# Patient Record
Sex: Female | Born: 1990
Health system: Southern US, Community
[De-identification: ages and names within clinical notes are randomized; demographics above are authoritative.]

## PROBLEM LIST (undated history)

## (undated) ENCOUNTER — Inpatient Hospital Stay (HOSPITAL_COMMUNITY): Payer: Self-pay

## (undated) DIAGNOSIS — B009 Herpesviral infection, unspecified: Secondary | ICD-10-CM

## (undated) DIAGNOSIS — Z789 Other specified health status: Secondary | ICD-10-CM

## (undated) HISTORY — PX: NO PAST SURGERIES: SHX2092

---
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
## 2010-05-02 LAB — CBC
HCT: 29.2 % — ABNORMAL LOW (ref 36.0–46.0)
HCT: 35 % — ABNORMAL LOW (ref 36.0–46.0)
Hemoglobin: 11.7 g/dL — ABNORMAL LOW (ref 12.0–15.0)
Hemoglobin: 9.9 g/dL — ABNORMAL LOW (ref 12.0–15.0)
MCHC: 33.5 g/dL (ref 30.0–36.0)
MCHC: 34.1 g/dL (ref 30.0–36.0)
MCV: 87.3 fL (ref 78.0–100.0)
Platelets: 168 10*3/uL (ref 150–400)
RBC: 3.36 MIL/uL — ABNORMAL LOW (ref 3.87–5.11)
RBC: 4 MIL/uL (ref 3.87–5.11)
RBC: 4.01 MIL/uL (ref 3.87–5.11)
RDW: 14.1 % (ref 11.5–15.5)
RDW: 14.1 % (ref 11.5–15.5)
RDW: 14.1 % (ref 11.5–15.5)
WBC: 11.8 10*3/uL — ABNORMAL HIGH (ref 4.0–10.5)
WBC: 12.5 10*3/uL — ABNORMAL HIGH (ref 4.0–10.5)
WBC: 13.9 10*3/uL — ABNORMAL HIGH (ref 4.0–10.5)

## 2010-05-02 LAB — RPR: RPR Ser Ql: NONREACTIVE

## 2010-05-02 LAB — ABO/RH: ABO/RH(D): O POS

## 2010-05-03 LAB — DIFFERENTIAL
Eosinophils Absolute: 0.4 10*3/uL (ref 0.0–0.7)
Eosinophils Relative: 3 % (ref 0–5)
Monocytes Relative: 8 % (ref 3–12)
Neutro Abs: 8.3 10*3/uL — ABNORMAL HIGH (ref 1.7–7.7)
Neutrophils Relative %: 71 % (ref 43–77)

## 2010-05-03 LAB — CBC
HCT: 34.1 % — ABNORMAL LOW (ref 36.0–46.0)
Hemoglobin: 11.6 g/dL — ABNORMAL LOW (ref 12.0–15.0)
MCHC: 33.9 g/dL (ref 30.0–36.0)
Platelets: 190 10*3/uL (ref 150–400)
Platelets: 181 10*3/uL (ref 150–400)
RBC: 3.92 MIL/uL (ref 3.87–5.11)
RDW: 14.1 % (ref 11.5–15.5)
WBC: 11.7 10*3/uL — ABNORMAL HIGH (ref 4.0–10.5)
WBC: 12.2 10*3/uL — ABNORMAL HIGH (ref 4.0–10.5)

## 2010-05-03 LAB — URINALYSIS, ROUTINE W REFLEX MICROSCOPIC
Bilirubin Urine: NEGATIVE
Protein, ur: NEGATIVE mg/dL
Specific Gravity, Urine: 1.015 (ref 1.005–1.030)
Specific Gravity, Urine: <1.005 — ABNORMAL LOW (ref 1.005–1.030)
pH: 6 (ref 5.0–8.0)
pH: 6.5 (ref 5.0–8.0)

## 2010-05-03 LAB — RPR
RPR Ser Ql: NONREACTIVE
RPR Ser Ql: NONREACTIVE

## 2010-05-08 LAB — URINALYSIS, ROUTINE W REFLEX MICROSCOPIC
Hgb urine dipstick: NEGATIVE
Protein, ur: NEGATIVE mg/dL
Specific Gravity, Urine: 1.01 (ref 1.005–1.030)

## 2010-05-29 ENCOUNTER — Inpatient Hospital Stay (HOSPITAL_COMMUNITY)
Admission: AD | Admit: 2010-05-29 | Discharge: 2010-05-29 | Disposition: A | Discharge status: Home / Self Care | Payer: BC Managed Care – PPO | Source: Ambulatory Visit | Attending: Obstetrics and Gynecology | Admitting: Obstetrics and Gynecology

## 2010-05-29 DIAGNOSIS — N764 Abscess of vulva: Secondary | ICD-10-CM | POA: Insufficient documentation

## 2010-05-29 DIAGNOSIS — L678 Other hair color and hair shaft abnormalities: Secondary | ICD-10-CM | POA: Insufficient documentation

## 2010-05-29 DIAGNOSIS — L738 Other specified follicular disorders: Secondary | ICD-10-CM | POA: Insufficient documentation

## 2010-06-01 LAB — CULTURE, ROUTINE-ABSCESS

## 2010-06-13 ENCOUNTER — Emergency Department (HOSPITAL_COMMUNITY)
Admission: EM | Admit: 2010-06-13 | Discharge: 2010-06-13 | Disposition: A | Discharge status: Home / Self Care | Payer: BC Managed Care – PPO | Attending: Emergency Medicine | Admitting: Emergency Medicine

## 2010-06-13 DIAGNOSIS — J029 Acute pharyngitis, unspecified: Secondary | ICD-10-CM | POA: Insufficient documentation

## 2010-06-13 DIAGNOSIS — J309 Allergic rhinitis, unspecified: Secondary | ICD-10-CM | POA: Insufficient documentation

## 2010-06-13 DIAGNOSIS — J3489 Other specified disorders of nose and nasal sinuses: Secondary | ICD-10-CM | POA: Insufficient documentation

## 2010-06-13 DIAGNOSIS — R0982 Postnasal drip: Secondary | ICD-10-CM | POA: Insufficient documentation

## 2010-06-13 LAB — RAPID STREP SCREEN (MED CTR MEBANE ONLY): Streptococcus, Group A Screen (Direct): NEGATIVE

## 2010-07-22 ENCOUNTER — Emergency Department (HOSPITAL_COMMUNITY): Payer: BC Managed Care – PPO

## 2010-07-22 ENCOUNTER — Emergency Department (HOSPITAL_COMMUNITY)
Admission: EM | Admit: 2010-07-22 | Discharge: 2010-07-22 | Disposition: A | Discharge status: Home / Self Care | Payer: BC Managed Care – PPO | Attending: Emergency Medicine | Admitting: Emergency Medicine

## 2010-07-22 DIAGNOSIS — M25569 Pain in unspecified knee: Secondary | ICD-10-CM | POA: Insufficient documentation

## 2010-07-22 DIAGNOSIS — M79609 Pain in unspecified limb: Secondary | ICD-10-CM | POA: Insufficient documentation

## 2010-07-22 DIAGNOSIS — IMO0002 Reserved for concepts with insufficient information to code with codable children: Secondary | ICD-10-CM | POA: Insufficient documentation

## 2010-07-22 DIAGNOSIS — Y92410 Unspecified street and highway as the place of occurrence of the external cause: Secondary | ICD-10-CM | POA: Insufficient documentation

## 2010-07-22 DIAGNOSIS — Y998 Other external cause status: Secondary | ICD-10-CM | POA: Insufficient documentation

## 2011-03-01 ENCOUNTER — Encounter (HOSPITAL_COMMUNITY): Payer: Self-pay | Admitting: *Deleted

## 2011-03-01 ENCOUNTER — Inpatient Hospital Stay (HOSPITAL_COMMUNITY)
Admission: AD | Admit: 2011-03-01 | Discharge: 2011-03-01 | Disposition: A | Discharge status: Home / Self Care | Payer: BC Managed Care – PPO | Source: Ambulatory Visit | Attending: Obstetrics and Gynecology | Admitting: Obstetrics and Gynecology

## 2011-03-01 DIAGNOSIS — B373 Candidiasis of vulva and vagina: Secondary | ICD-10-CM

## 2011-03-01 DIAGNOSIS — B3731 Acute candidiasis of vulva and vagina: Secondary | ICD-10-CM | POA: Insufficient documentation

## 2011-03-01 DIAGNOSIS — L293 Anogenital pruritus, unspecified: Secondary | ICD-10-CM | POA: Insufficient documentation

## 2011-03-01 DIAGNOSIS — N390 Urinary tract infection, site not specified: Secondary | ICD-10-CM | POA: Insufficient documentation

## 2011-03-01 HISTORY — DX: Other specified health status: Z78.9

## 2011-03-01 LAB — URINALYSIS, ROUTINE W REFLEX MICROSCOPIC
Nitrite: NEGATIVE
Protein, ur: NEGATIVE mg/dL
pH: 6.5 (ref 5.0–8.0)

## 2011-03-01 LAB — WET PREP, GENITAL: Trich, Wet Prep: NONE SEEN

## 2011-03-01 LAB — POCT PREGNANCY, URINE: Preg Test, Ur: NEGATIVE

## 2011-03-01 LAB — URINE MICROSCOPIC-ADD ON

## 2011-03-01 MED ORDER — SULFAMETHOXAZOLE-TRIMETHOPRIM 800-160 MG PO TABS: 1.0000 | ORAL_TABLET | Freq: Two times a day (BID) | ORAL | Status: DC

## 2011-03-01 MED ORDER — FLUCONAZOLE 150 MG PO TABS: 150.0000 mg | ORAL_TABLET | Freq: Once | ORAL | Status: AC

## 2011-03-01 MED ORDER — FLUCONAZOLE 150 MG PO TABS: 150.0000 mg | ORAL_TABLET | Freq: Once | ORAL | Status: DC

## 2011-03-01 MED ORDER — SULFAMETHOXAZOLE-TRIMETHOPRIM 800-160 MG PO TABS: 1.0000 | ORAL_TABLET | Freq: Two times a day (BID) | ORAL | Status: AC

## 2011-03-01 MED ORDER — FLUCONAZOLE 150 MG PO TABS
150.0000 mg | ORAL_TABLET | Freq: Once | ORAL | Status: AC
  Administered 2011-03-01: 150 mg via ORAL
  Filled 2011-03-01: qty 1

## 2011-03-01 MED ORDER — SULFAMETHOXAZOLE-TMP DS 800-160 MG PO TABS
1.0000 | ORAL_TABLET | Freq: Once | ORAL | Status: AC
  Administered 2011-03-01: 1 via ORAL
  Filled 2011-03-01: qty 1

## 2011-03-01 NOTE — Progress Notes (Signed)
Pt in c/o vaginal and anal irritation x2 days.  [facility] and had bacterial vaginosis, was tested for all STDS, were negative. Denies any discharge.

## 2011-03-01 NOTE — ED Provider Notes (Signed)
History     CSN: [id number]  Arrival date & time 03/01/11  2154   None     Chief Complaint  Patient presents with  . Vaginal Itching    HPI Kimberly Santos is a 21 y.o. female who presents to MAU for vaginal itching and discharge. She was treated with antibiotics a couple weeks ago for Bacterial Vaginosis and then developed the itching. She has been using Vagasil which seems to make the symptoms worse. She denies any other problems. The history was provided by the patient.  Past Medical History  Diagnosis Date  . No pertinent past medical history     Past Surgical History  Procedure Date  . No past surgeries     History reviewed. No pertinent family history.  History  Substance Use Topics  . Smoking status: Never Smoker   . Smokeless tobacco: Not on file  . Alcohol Use: No    OB History    Grav Para Term Preterm Abortions TAB SAB Ect Mult Living   1 1 1       1       Review of Systems  Genitourinary: Positive for dysuria, frequency and vaginal discharge. Vaginal pain: vaginal itching.  All other systems reviewed and are negative.    Allergies  Penicillins  Home Medications  No current outpatient prescriptions on file.  BP 125/77  Pulse 87  Temp(Src) 98.7 F (37.1 C) (Oral)  Resp 18  Ht 5\' 3"  (1.6 m)  Wt 132 lb (59.875 kg)  BMI 23.38 kg/m2  LMP 01/18/2011  Physical Exam  Nursing note and vitals reviewed. Constitutional: She is oriented to person, place, and time. She appears well-developed and well-nourished.  HENT:  Head: Normocephalic.  Eyes: EOM are normal.  Neck: Neck supple.  Cardiovascular: Normal rate.   Pulmonary/Chest: Effort normal.  Abdominal: Soft. There is no tenderness.  Genitourinary:       External genitalia without lesions, irritation noted. Thick white cheesy discharge vaginal vault. No CMT, no adnexal tenderness or mass palpated. Uterus without palpable enlargement.  Musculoskeletal: Normal range of motion.  Neurological:  She is alert and oriented to person, place, and time. No cranial nerve deficit.  Skin: Skin is warm and dry.  Psychiatric: She has a normal mood and affect. Her behavior is normal. Judgment and thought content normal.   Results for orders placed during the hospital encounter of 03/01/11 (from the past 24 hour(s))  URINALYSIS, ROUTINE W REFLEX MICROSCOPIC     Status: Abnormal   Collection Time   03/01/11 10:15 PM      Component Value Range   Color, Urine YELLOW  YELLOW    APPearance CLEAR  CLEAR    Specific Gravity, Urine 1.020  1.005 - 1.030    pH 6.5  5.0 - 8.0    Glucose, UA NEGATIVE  NEGATIVE (mg/dL)   Hgb urine dipstick NEGATIVE  NEGATIVE    Bilirubin Urine NEGATIVE  NEGATIVE    Ketones, ur 15 (*) NEGATIVE (mg/dL)   Protein, ur NEGATIVE  NEGATIVE (mg/dL)   Urobilinogen, UA 1.0  0.0 - 1.0 (mg/dL)   Nitrite NEGATIVE  NEGATIVE    Leukocytes, UA MODERATE (*) NEGATIVE   URINE MICROSCOPIC-ADD ON     Status: Abnormal   Collection Time   03/01/11 10:15 PM      Component Value Range   Squamous Epithelial / LPF FEW (*) RARE    WBC, UA 11-20  <3 (WBC/hpf)   RBC / HPF 0-2  <  3 (RBC/hpf)   Bacteria, UA FEW (*) RARE    Urine-Other MUCOUS PRESENT    WET PREP, GENITAL     Status: Abnormal   Collection Time   03/01/11 10:41 PM      Component Value Range   Yeast, Wet Prep RARE (*) NONE SEEN    Trich, Wet Prep NONE SEEN  NONE SEEN    Clue Cells, Wet Prep NONE SEEN  NONE SEEN    WBC, Wet Prep HPF POC MANY (*) NONE SEEN   POCT PREGNANCY, URINE     Status: Normal   Collection Time   03/01/11 10:55 PM      Component Value Range   Preg Test, Ur NEGATIVE     Assessment: Monilia vaginosis   UTI  Plan:  Diflucan 150 mg po now   Rx Bactrim DS    OTC yeast cream   Cultures pending.  ED Course  Procedures    MDM          Kerrie Buffalo, NP 03/01/11 2303

## 2011-03-02 LAB — GC/CHLAMYDIA PROBE AMP, GENITAL: GC Probe Amp, Genital: NEGATIVE

## 2011-03-03 NOTE — ED Provider Notes (Signed)
Attestation of Attending Supervision of Advanced Practitioner: Evaluation and management procedures were performed by the PA/NP/CNM/OB Fellow under my supervision/collaboration. Chart reviewed and agree with management and plan.  Kamy Poinsett V 03/03/2011 4:21 PM

## 2012-03-01 IMAGING — US US OB LIMITED
1 series · 13 of 13 positions shown · non-contrast
Comparison: none

OBSTETRICAL ULTRASOUND:
 This ultrasound was performed in The [HOSPITAL], and the [facility] report will be stored to [REDACTED] PACS.  This report is also available in [HOSPITAL]?s accessANYware.

[Series 1: us ob limited · 13 acquisitions, 13 frames shown]
[im 1/13]
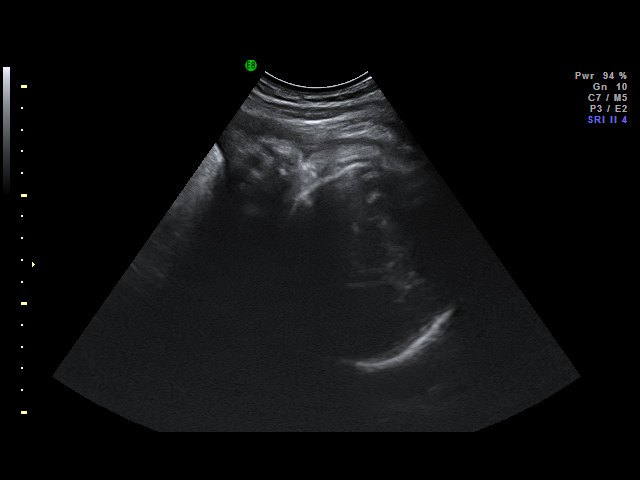
[im 2/13]
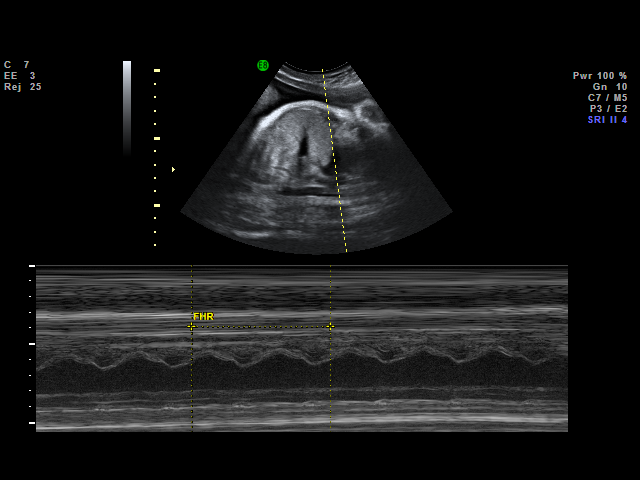
[im 3/13]
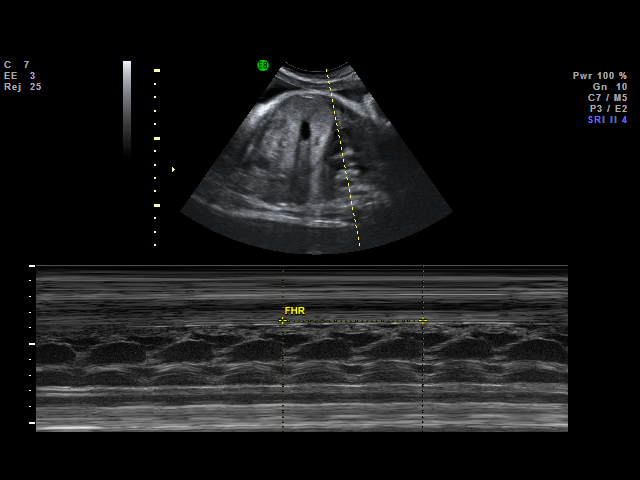
[im 4/13]
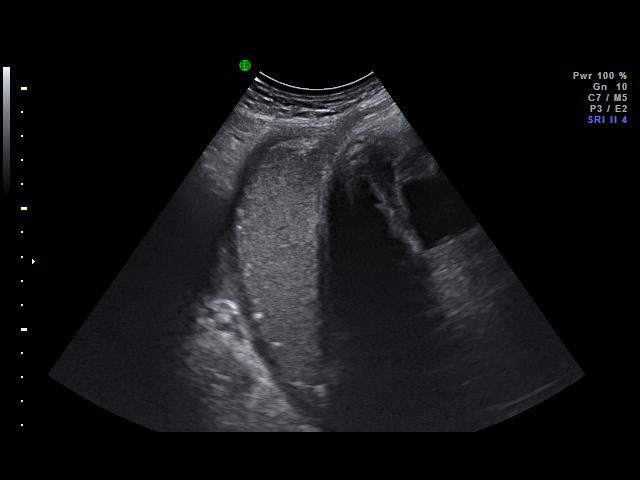
[im 5/13]
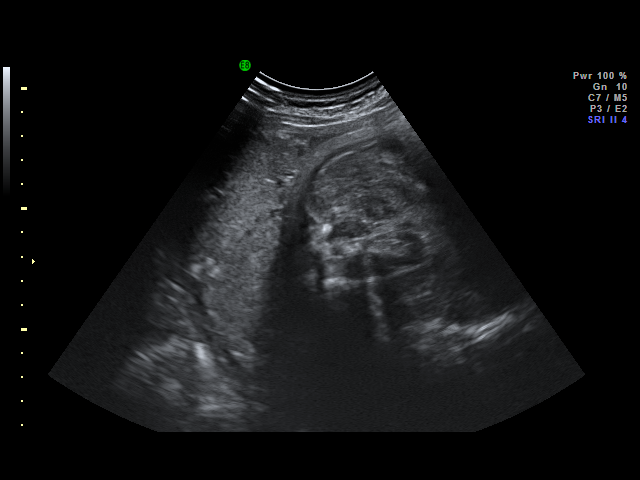
[im 6/13]
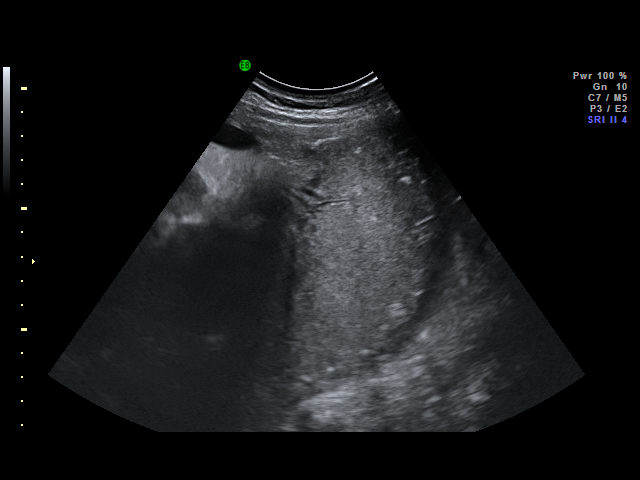
[im 7/13]
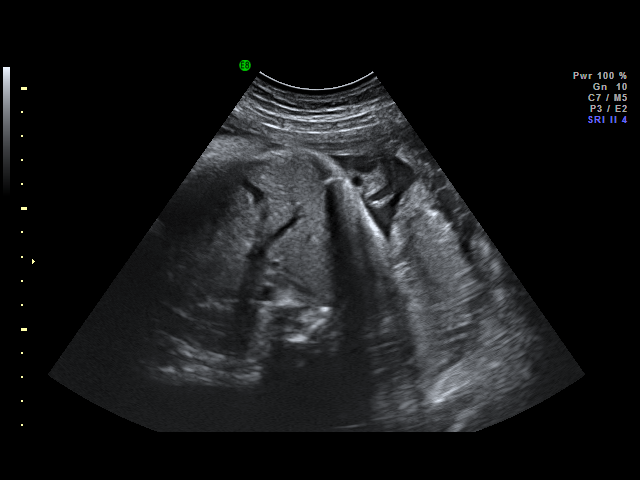
[im 8/13]
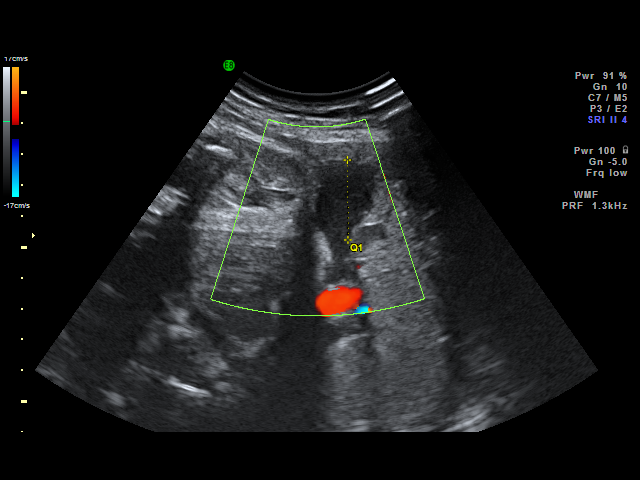
[im 9/13]
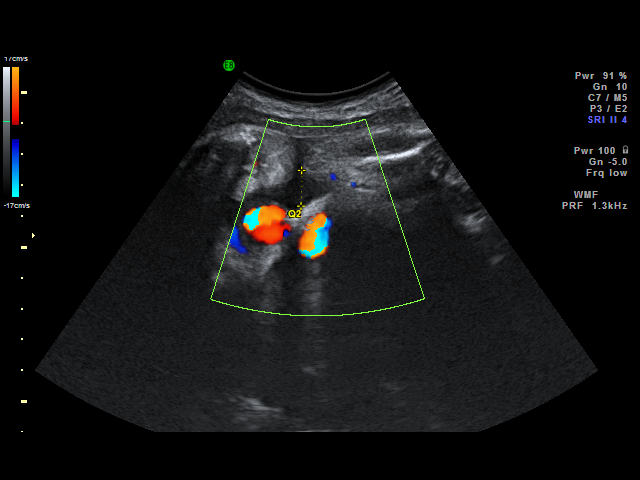
[im 10/13]
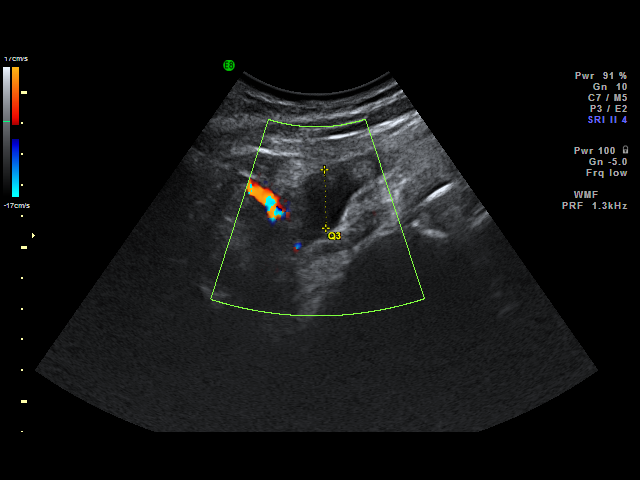
[im 11/13]
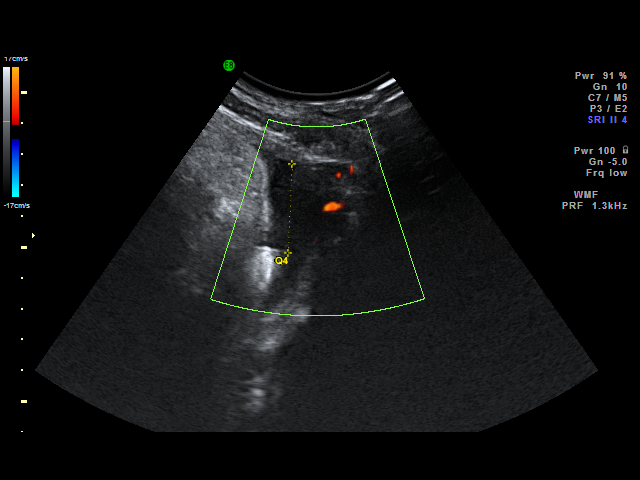
[im 12/13]
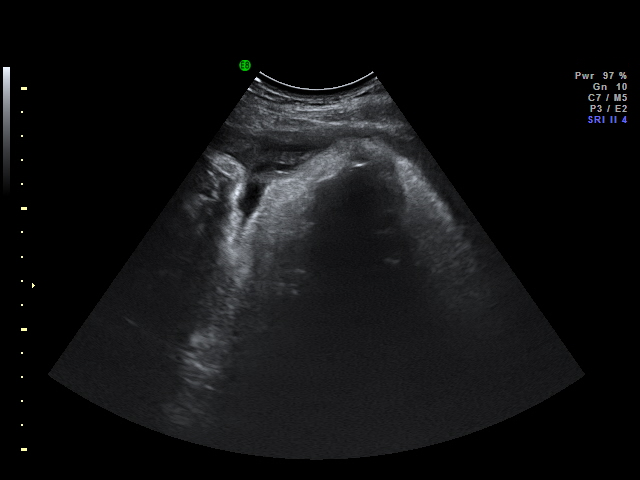
[im 13/13]
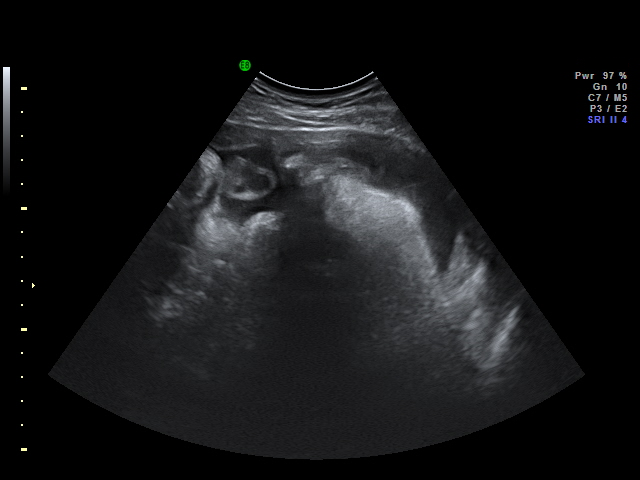

[13 of 13 positions shown; findings below may reference images not displayed]

IMPRESSION: [facility] has also been faxed to the ordering physician.

## 2012-06-27 ENCOUNTER — Inpatient Hospital Stay (HOSPITAL_COMMUNITY)
Admission: AD | Admit: 2012-06-27 | Discharge: 2012-06-27 | Disposition: A | Discharge status: Home / Self Care | Payer: BC Managed Care – PPO | Source: Ambulatory Visit | Attending: Obstetrics & Gynecology | Admitting: Obstetrics & Gynecology

## 2012-06-27 ENCOUNTER — Encounter (HOSPITAL_COMMUNITY): Payer: Self-pay | Admitting: *Deleted

## 2012-06-27 DIAGNOSIS — R109 Unspecified abdominal pain: Secondary | ICD-10-CM | POA: Insufficient documentation

## 2012-06-27 DIAGNOSIS — B9689 Other specified bacterial agents as the cause of diseases classified elsewhere: Secondary | ICD-10-CM

## 2012-06-27 DIAGNOSIS — A499 Bacterial infection, unspecified: Secondary | ICD-10-CM

## 2012-06-27 DIAGNOSIS — N760 Acute vaginitis: Secondary | ICD-10-CM | POA: Insufficient documentation

## 2012-06-27 DIAGNOSIS — IMO0002 Reserved for concepts with insufficient information to code with codable children: Secondary | ICD-10-CM | POA: Insufficient documentation

## 2012-06-27 LAB — CBC WITH DIFFERENTIAL/PLATELET
Eosinophils Absolute: 0.2 10*3/uL (ref 0.0–0.7)
Eosinophils Relative: 3 % (ref 0–5)
HCT: 39.8 % (ref 36.0–46.0)
Hemoglobin: 13.5 g/dL (ref 12.0–15.0)
Lymphs Abs: 3.3 10*3/uL (ref 0.7–4.0)
MCH: 27.8 pg (ref 26.0–34.0)
MCV: 82.1 fL (ref 78.0–100.0)
Monocytes Relative: 8 % (ref 3–12)
RBC: 4.85 MIL/uL (ref 3.87–5.11)

## 2012-06-27 LAB — URINALYSIS, ROUTINE W REFLEX MICROSCOPIC
Bilirubin Urine: NEGATIVE
Glucose, UA: NEGATIVE mg/dL
Ketones, ur: NEGATIVE mg/dL
pH: 6.5 (ref 5.0–8.0)

## 2012-06-27 LAB — WET PREP, GENITAL: Trich, Wet Prep: NONE SEEN

## 2012-06-27 MED ORDER — METRONIDAZOLE 500 MG PO TABS: 500.0000 mg | ORAL_TABLET | Freq: Two times a day (BID) | ORAL | Status: DC

## 2012-06-27 NOTE — MAU Provider Note (Signed)
History     CSN: [id number]  Arrival date and time: 06/27/12 1478   First Provider Initiated Contact with Patient 06/27/12 1814      Chief Complaint  Patient presents with  . Abdominal Cramping   HPI Kimberly Santos is 22 y.o. [id number] Unknown weeks presenting with cramping.  Had TAB at [facility] [place] 3 weeks ago.  Did not go for follow up.  LMP 06/20/12 described as a "normal" cycle.  She began OCP 6 days ago.  Denies bleeding but has a vaginal discharge with odor.  Denies fever, chills, or UTI.     Past Medical History  Diagnosis Date  . No pertinent past medical history     Past Surgical History  Procedure Laterality Date  . No past surgeries      History reviewed. No pertinent family history.  History  Substance Use Topics  . Smoking status: Never Smoker   . Smokeless tobacco: Not on file  . Alcohol Use: No    Allergies:  Allergies  Allergen Reactions  . Penicillins Other (See Comments)    Childhood allergy; reaction unknown    Prescriptions prior to admission  Medication Sig Dispense Refill  . acetaminophen (TYLENOL) 500 MG tablet Take 500 mg by mouth every 6 (six) hours as needed for pain.      . diphenhydrAMINE (BENADRYL) 25 MG tablet Take 25 mg by mouth every 6 (six) hours as needed for allergies.      Marland Kitchen DM-Doxylamine-Acetaminophen (NYQUIL COLD & FLU PO) Take 2 capsules by mouth at bedtime as needed (For allergy symptoms).         Review of Systems  Constitutional: Negative for fever and chills.  Gastrointestinal: Positive for abdominal pain (cramping).  Genitourinary: Negative for dysuria, urgency and frequency.       + for vaginal discharge with odor.  No vaginal bleeding   Physical Exam   Blood pressure 109/63, pulse 84, temperature 97.7 F (36.5 C), temperature source Oral, resp. rate 18, height 5\' 3"  (1.6 m), weight 143 lb (64.864 kg).  Physical Exam  Constitutional: She is oriented to person, place, and time. She appears  well-developed and well-nourished. No distress.  HENT:  Head: Normocephalic.  Neck: Normal range of motion.  Cardiovascular: Normal rate.   Respiratory: Effort normal.  GI: Soft. She exhibits no distension and no mass. There is no tenderness. There is no rebound and no guarding.  Genitourinary: There is no rash, tenderness or lesion on the right labia. There is no rash, tenderness or lesion on the left labia. Uterus is not enlarged and not tender. Cervix exhibits no discharge and no friability. Right adnexum displays no mass, no tenderness and no fullness. Left adnexum displays no mass, no tenderness and no fullness. No erythema, tenderness or bleeding around the vagina. Vaginal discharge (malodorous frothy discharge-small amount) found.  Neurological: She is alert and oriented to person, place, and time.  Skin: Skin is warm and dry.  Psychiatric: She has a normal mood and affect. Her behavior is normal.   Results for orders placed during the hospital encounter of 06/27/12 (from the past 24 hour(s))  URINALYSIS, ROUTINE W REFLEX MICROSCOPIC     Status: None   Collection Time    06/27/12  4:50 PM      Result Value Range   Color, Urine YELLOW  YELLOW   APPearance CLEAR  CLEAR   Specific Gravity, Urine 1.010  1.005 - 1.030   pH 6.5  5.0 - 8.0  Glucose, UA NEGATIVE  NEGATIVE mg/dL   Hgb urine dipstick NEGATIVE  NEGATIVE   Bilirubin Urine NEGATIVE  NEGATIVE   Ketones, ur NEGATIVE  NEGATIVE mg/dL   Protein, ur NEGATIVE  NEGATIVE mg/dL   Urobilinogen, UA 0.2  0.0 - 1.0 mg/dL   Nitrite NEGATIVE  NEGATIVE   Leukocytes, UA NEGATIVE  NEGATIVE  POCT PREGNANCY, URINE     Status: None   Collection Time    06/27/12  5:44 PM      Result Value Range   Preg Test, Ur NEGATIVE  NEGATIVE  WET PREP, GENITAL     Status: Abnormal   Collection Time    06/27/12  6:25 PM      Result Value Range   Yeast Wet Prep HPF POC NONE SEEN  NONE SEEN   Trich, Wet Prep NONE SEEN  NONE SEEN   Clue Cells Wet Prep  HPF POC MODERATE (*) NONE SEEN   WBC, Wet Prep HPF POC FEW (*) NONE SEEN  CBC WITH DIFFERENTIAL     Status: None   Collection Time    06/27/12  6:36 PM      Result Value Range   WBC 7.5  4.0 - 10.5 K/uL   RBC 4.85  3.87 - 5.11 MIL/uL   Hemoglobin 13.5  12.0 - 15.0 g/dL   HCT 16.1  09.6 - 04.5 %   MCV 82.1  78.0 - 100.0 fL   MCH 27.8  26.0 - 34.0 pg   MCHC 33.9  30.0 - 36.0 g/dL   RDW 40.9  81.1 - 91.4 %   Platelets 334  150 - 400 K/uL   Neutrophils Relative 46  43 - 77 %   Neutro Abs 3.5  1.7 - 7.7 K/uL   Lymphocytes Relative 43  12 - 46 %   Lymphs Abs 3.3  0.7 - 4.0 K/uL   Monocytes Relative 8  3 - 12 %   Monocytes Absolute 0.6  0.1 - 1.0 K/uL   Eosinophils Relative 3  0 - 5 %   Eosinophils Absolute 0.2  0.0 - 0.7 K/uL   Basophils Relative 0  0 - 1 %   Basophils Absolute 0.0  0.0 - 0.1 K/uL    MAU Course  Procedures   GC/CHL culture to lab  MDM  Assessment and Plan  A:  Bacterial vaginosis      Post TAB  P: Rx for Flagyl 500mg  po bid X 1 week    ETOH warning     Pelvic rest until treatment is completed     Continue OCPs as prescribed by the clinci   Osinachi Navarrette,EVE M 06/27/2012, 7:09 PM

## 2012-06-27 NOTE — MAU Note (Signed)
Pt reports having an abortion about 3 weeks ago. SHe is still having cramping. Reports heavy brownish red discharge with a foal odor.

## 2012-06-29 LAB — GC/CHLAMYDIA PROBE AMP
CT Probe RNA: NEGATIVE
GC Probe RNA: NEGATIVE

## 2013-12-20 ENCOUNTER — Encounter (HOSPITAL_COMMUNITY): Payer: Self-pay | Admitting: *Deleted

## 2014-04-21 ENCOUNTER — Emergency Department (INDEPENDENT_AMBULATORY_CARE_PROVIDER_SITE_OTHER)
Admission: EM | Admit: 2014-04-21 | Discharge: 2014-04-21 | Disposition: A | Discharge status: Home / Self Care | Payer: BLUE CROSS/BLUE SHIELD | Source: Home / Self Care | Attending: Emergency Medicine | Admitting: Emergency Medicine

## 2014-04-21 ENCOUNTER — Encounter (HOSPITAL_COMMUNITY): Payer: Self-pay | Admitting: *Deleted

## 2014-04-21 DIAGNOSIS — B001 Herpesviral vesicular dermatitis: Secondary | ICD-10-CM

## 2014-04-21 MED ORDER — VALACYCLOVIR HCL 1 G PO TABS: 500.0000 mg | ORAL_TABLET | Freq: Two times a day (BID) | ORAL | Status: DC

## 2014-04-21 NOTE — ED Provider Notes (Signed)
   Chief Complaint   Oral Swelling   History of Present Illness   Renell B Robby SermonHendricks is a 24 year old female who has had a two-day history of a blister on her right upper lip. This is painful to touch. She had something similar about a month ago which healed up eventually on its own. She denies any obvious precipitating factors such as viral URI or fever. Denies any intraoral lesions, sore throat, headache, or adenopathy.  Review of Systems   Other than as noted above, the patient denies any of the following symptoms: Systemic:  No fevers or chills. Eye:  No redness, pain, discharge, itching, blurred vision, or diplopia. ENT:  No headache, nasal congestion, sneezing, itching, epistaxis, ear pain, decreased hearing, ringing in ears, vertigo, or tinnitus.  No oral lesions, sore throat, or hoarseness. Neck:  No neck pain or adenopathy. Skin:  No rash or itching.  PMFSH   Past medical history, family history, social history, meds, and allergies were reviewed.   Physical Examination     Vital signs:  BP 124/79 mmHg  Pulse 84  Temp(Src) 99.5 F (37.5 C)  Resp 12  SpO2 97% General:  Alert and oriented.  In no distress.   Eye:  PERRL, full EOMs, lids and conjunctiva normal.   ENT:  TMs and canals clear.  Nasal mucosa not congested and without drainage.  Mucous membranes moist, no intraoral or tongue lesions, normal dentition, pharynx clear.  No cranial or facial pain to palplation. There is no pain or swelling over the mastoid. She has a crusted ulcer on the right upper lip. Neck:  Supple, full ROM.  No adenopathy, tenderness or mass.  Thyroid normal. Skin:  Clear, warm and dry.   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  Plan    1.  Meds:  The following meds were prescribed:   Discharge Medication List as of 04/21/2014 11:03 AM    START taking these medications   Details  valACYclovir (VALTREX) 1000 MG tablet Take 0.5 tablets (500 mg total) by mouth 2 (two)  times daily., Starting 04/21/2014, Until Discontinued, Normal        2.  Patient Education/Counseling:  The patient was given appropriate handouts, self care instructions, and instructed in symptomatic relief.    3.  Follow up:  The patient was told to follow up here if no better in 3 to 4 days, or sooner if becoming worse in any way, and given some red flag symptoms such as intraoral lesions, difficulty swallowing or breathing which would prompt immediate return.       Reuben Likesavid C Mady Oubre, MD 04/21/14 2029

## 2014-04-21 NOTE — Discharge Instructions (Signed)
Herpes Labialis  You have a fever blister or cold sore (herpes labialis). These painful, grouped sores are caused by one of the herpes viruses (HSV1 most commonly). They are usually found around the lips and mouth, but the same infection can also affect other areas on the face such as the nose and eyes. Herpes infections take about 10 days to heal. They often occur again and again in the same spot. Other symptoms may include numbness and tingling in the involved skin, achiness, fever, and swollen glands in the neck. Colds, emotional stress, injuries, or excess sunlight exposure all seem to make herpes reappear. Herpes lip infections are contagious. Direct contact with these sores can spread the infection. It can also be spread to other parts of your own body.  TREATMENT   Herpes labialis is usually self-limited and resolves within 1 week. To reduce pain and swelling, apply ice packs frequently to the sores or suck on popsicles or frozen juice bars. Antiviral medicine may be used by mouth to shorten the duration of the breakout. Avoid spreading the infection by washing your hands often. Be careful not to touch your eyes or genital areas after handling the infected blisters. Do not kiss or have other intimate contact with others. After the blisters are completely healed you may resume contact. Use sunscreen to lessen recurrences.   If this is your first infection with herpes, or if you have a severe or repeated infections, your caregiver may prescribe one of the anti-viral drugs to speed up the healing. If you have sun-related flare-ups despite the use of sunscreen, starting oral anti-viral medicine before a prolonged exposure (going skiing or to the beach) can prevent most episodes.   SEEK IMMEDIATE MEDICAL CARE IF:  · You develop a headache, sleepiness, high fever, vomiting, or severe weakness.  · You have eye irritation, pain, blurred vision or redness.  · You develop a prolonged infection not getting better in 10  days.  Document Released: 02/04/2005 Document Revised: 04/29/2011 Document Reviewed: 12/09/2008  ExitCare® Patient Information ©2015 [employer]. This information is not intended to replace advice given to you by your health care provider. Make sure you discuss any questions you have with your health care provider.

## 2014-04-21 NOTE — ED Notes (Signed)
Pt  Has  A  painfull  Swollen  Sore  On  Her  Top  Lip   X  sev  Weeks  -    She  Reports  It  Got  Better  And  Came  Back 1  Week  Ago     She  Reports  The  Symptoms  Not  releived  By otc  meds         She  Is  Sitting  Upright on  The  Exam table  Speaking in  Complete  sentances     In no  Distress

## 2015-03-02 ENCOUNTER — Encounter (HOSPITAL_COMMUNITY): Payer: Self-pay | Admitting: *Deleted

## 2015-03-02 ENCOUNTER — Inpatient Hospital Stay (HOSPITAL_COMMUNITY)
Admission: AD | Admit: 2015-03-02 | Discharge: 2015-03-02 | Disposition: A | Discharge status: Home / Self Care | Payer: BLUE CROSS/BLUE SHIELD | Source: Ambulatory Visit | Attending: Obstetrics & Gynecology | Admitting: Obstetrics & Gynecology

## 2015-03-02 DIAGNOSIS — N63 Unspecified lump in breast: Secondary | ICD-10-CM | POA: Insufficient documentation

## 2015-03-02 DIAGNOSIS — N6311 Unspecified lump in the right breast, upper outer quadrant: Secondary | ICD-10-CM

## 2015-03-02 LAB — URINALYSIS, ROUTINE W REFLEX MICROSCOPIC
Bilirubin Urine: NEGATIVE
GLUCOSE, UA: NEGATIVE mg/dL
Ketones, ur: NEGATIVE mg/dL
LEUKOCYTES UA: NEGATIVE
Nitrite: NEGATIVE
PH: 6 (ref 5.0–8.0)
Protein, ur: NEGATIVE mg/dL
SPECIFIC GRAVITY, URINE: 1.02 (ref 1.005–1.030)

## 2015-03-02 LAB — URINE MICROSCOPIC-ADD ON

## 2015-03-02 LAB — POCT PREGNANCY, URINE: Preg Test, Ur: NEGATIVE

## 2015-03-02 NOTE — MAU Note (Signed)
PT SAYS  SHE HAS  INPLANON  IN  LEFT      ARM   INSERTED   BY  PLANNED  PARENTHOOD   IN 01-2014.    LAST SEX-    1 HR  AGO.          SAYS HER  RIGHT  BREAST   HAS  BEEN  SORE  ON OUTER   AREA  X1 WEEK   AND  SHE  FELT  A  LUMP  LAST  NIGHT .       HURTS  SO BAD  CAN'T  SLEEP.      NO MEDS   FOR  PAIN

## 2015-03-02 NOTE — MAU Provider Note (Signed)
History     CSN: [id number]  Arrival date and time: 03/02/15 2121   First Provider Initiated Contact with Patient 03/02/15 2203      Chief Complaint  Patient presents with  . Breast Pain   HPI Comments: Kimberly Santos is a 25 y.o. Who presents today with right breast pain. She states that the pain has been there for about two weeks, but was worse today. She states that during a shower today she felt a lump. She states that she has had a piercing in that nipple for about. She states that occasionally it will ooze. She rates her pain currently 7/10. She took advil without any relief. She states nothing makes it worse and nothing makes it better.   Past Medical History  Diagnosis Date  . No pertinent past medical history     Past Surgical History  Procedure Laterality Date  . No past surgeries      No family history on file.  Social History  Substance Use Topics  . Smoking status: Never Smoker   . Smokeless tobacco: None  . Alcohol Use: No    Allergies:  Allergies  Allergen Reactions  . Penicillins Other (See Comments)    Childhood allergy; reaction unknown    Prescriptions prior to admission  Medication Sig Dispense Refill Last Dose  . acetaminophen (TYLENOL) 500 MG tablet Take 500 mg by mouth every 6 (six) hours as needed for pain.   06/26/2012 at Unknown  . diphenhydrAMINE (BENADRYL) 25 MG tablet Take 25 mg by mouth every 6 (six) hours as needed for allergies.   06/26/2012 at Unknown  . DM-Doxylamine-Acetaminophen (NYQUIL COLD & FLU PO) Take 2 capsules by mouth at bedtime as needed (For allergy symptoms).    06/26/2012 at Unknown  . metroNIDAZOLE (FLAGYL) 500 MG tablet Take 1 tablet (500 mg total) by mouth 2 (two) times daily. 14 tablet 0   . valACYclovir (VALTREX) 1000 MG tablet Take 0.5 tablets (500 mg total) by mouth 2 (two) times daily. 7 tablet 99     Review of Systems  Constitutional: Negative for fever and chills.  Gastrointestinal: Negative for nausea,  vomiting, abdominal pain, diarrhea and constipation.  Genitourinary: Negative for dysuria, urgency and frequency.   Physical Exam   Blood pressure 109/68, pulse 95, temperature 98.5 F (36.9 C), temperature source Oral, resp. rate 20, height 5\' 5"  (1.651 m), weight 76.771 kg (169 lb 4 oz), last menstrual period 03/02/2015.  Physical Exam  Nursing note and vitals reviewed. Constitutional: She is oriented to person, place, and time. She appears well-developed and well-nourished. No distress.  HENT:  Head: Normocephalic.  Cardiovascular: Normal rate.   Respiratory: Effort normal. Right breast exhibits tenderness. Mass: enlarged tender duct.    GI: Soft. There is no tenderness. There is no rebound.  Neurological: She is alert and oriented to person, place, and time.  Skin: Skin is warm and dry.    MAU Course  Procedures  MDM   Assessment and Plan   1. Breast lump on right side at 11 o'clock position    DC home Comfort measures reviewed  RX: none, continue ibuprofen OTC  Return to MAU as needed   Follow-up Information    Follow up with [facility].   Specialty:  Radiology   Why:  They will call you with an appointment   Contact information:   [address] [id number] mc [place] [address] [phone]  Follow up with [facility].   Specialty:  Obstetrics and Gynecology   Why:  They will call you with an appointment   Contact information:   [address] [place] [id number] [phone]        Tawnya CrookHogan, Heather Donovan 03/02/2015, 10:03 PM

## 2015-03-02 NOTE — Discharge Instructions (Signed)
Breast Cyst A breast cyst is a sac in the breast that is filled with fluid. Breast cysts are common in women. Women can have one or many cysts. When the breasts contain many cysts, it is usually due to a noncancerous (benign) condition called fibrocystic change. These lumps form under the influence of female hormones (estrogen and progesterone). The lumps are most often located in the upper, outer portion of the breast. They are often more swollen, painful, and tender before your period starts. They usually disappear after menopause, unless you are on hormone therapy.  There are several types of cysts:  Macrocyst. This is a cyst that is about 2 in. (5.1 cm) in diameter.   Microcyst. This is a tiny cyst that you cannot feel but can be seen with a mammogram or an ultrasound.   Galactocele. This is a cyst containing milk that may develop if you suddenly stop breastfeeding.   Sebaceous cyst of the skin. This type of cyst is not in the breast tissue itself. Breast cysts do not increase your risk of breast cancer. However, they must be monitored closely because they can be cancerous.  CAUSES  It is not known exactly what causes a breast cyst to form. Possible causes include:  An overgrowth of milk glands and connective tissue in the breast can block the milk glands, causing them to fill with fluid.   Scar tissue in the breast from previous surgery may block the glands, causing a cyst.  RISK FACTORS Estrogen may influence the development of a breast cyst.  SIGNS AND SYMPTOMS   Feeling a smooth, round, soft lump (like a grape) in the breast that is easily moveable.   Breast discomfort or pain.  Increase in size of the lump before your menstrual period and decrease in its size after your menstrual period.  DIAGNOSIS  A cyst can be felt during a physical exam by your health care provider. A breast X-ray exam (mammogram) and ultrasonography will be done to confirm the diagnosis. Fluid may  be removed from the cyst with a needle (fine needle aspiration) to make sure the cyst is not cancerous.  TREATMENT  Treatment may not be necessary. Your health care provider may monitor the cyst to see if it goes away on its own. If treatment is needed, it may include:  Hormone treatment.   Needle aspiration. There is a chance of the cyst coming back after aspiration.   Surgery to remove the whole cyst.  HOME CARE INSTRUCTIONS   Keep all follow-up appointments with your health care provider.  See your health care provider regularly:  Get a yearly exam by your health care provider.  Have a clinical breast exam by a health care provider every 1-3 years if you are 20-40 years of age. After age 40 years, you should have the exam every year.   Get mammogram tests as directed by your health care provider.   Understand the normal appearance and feel of your breasts and perform breast self-exams.   Only take over-the-counter or prescription medicines as directed by your health care provider.   Wear a supportive bra, especially when exercising.   Avoid caffeine.   Reduce your salt intake, especially before your menstrual period. Too much salt can cause fluid retention, breast swelling, and discomfort.  SEEK MEDICAL CARE IF:   You feel, or think you feel, a lump in your breast.   You notice that both breasts look or feel different than usual.   Your   breast is still causing pain after your menstrual period is over.   You need medicine for breast pain and swelling that occurs with your menstrual period.  SEEK IMMEDIATE MEDICAL CARE IF:   You have severe pain, tenderness, redness, or warmth in your breast.   You have nipple discharge or bleeding.   Your breast lump becomes hard and painful.   You find new lumps or bumps that were not there before.   You feel lumps in your armpit (axilla).   You notice dimpling or wrinkling of the breast or nipple.   You  have a fever.  MAKE SURE YOU:  Understand these instructions.  Will watch your condition.  Will get help right away if you are not doing well or get worse.   This information is not intended to replace advice given to you by your health care provider. Make sure you discuss any questions you have with your health care provider.   Document Released: 02/04/2005 Document Revised: 10/07/2012 Document Reviewed: 09/03/2012 Elsevier Interactive Patient Education 2016 [employer]  

## 2015-03-16 ENCOUNTER — Encounter: Payer: BLUE CROSS/BLUE SHIELD | Admitting: Obstetrics and Gynecology

## 2015-03-29 ENCOUNTER — Other Ambulatory Visit: Payer: BLUE CROSS/BLUE SHIELD

## 2015-03-29 ENCOUNTER — Ambulatory Visit
Admission: RE | Admit: 2015-03-29 | Discharge: 2015-03-29 | Disposition: A | Discharge status: Home / Self Care | Payer: BLUE CROSS/BLUE SHIELD | Source: Ambulatory Visit | Attending: Advanced Practice Midwife | Admitting: Advanced Practice Midwife

## 2015-03-29 DIAGNOSIS — N6311 Unspecified lump in the right breast, upper outer quadrant: Secondary | ICD-10-CM

## 2016-12-31 NOTE — Progress Notes (Deleted)
No chief complaint on file.   HPI: Patient  Kimberly Santos  26 y.o. comes in today for new patient Care visit   There are no preventive care reminders to display for this patient. Health Maintenance Review LIFESTYLE:  Exercise:   Tobacco/ETS: Alcohol:  Sugar beverages: Sleep: Drug use: no HH of  Work:    ROS:  GEN/ HEENT: No fever, significant weight changes sweats headaches vision problems hearing changes, CV/ PULM; No chest pain shortness of breath cough, syncope,edema  change in exercise tolerance. GI /GU: No adominal pain, vomiting, change in bowel habits. No blood in the stool. No significant GU symptoms. SKIN/HEME: ,no acute skin rashes suspicious lesions or bleeding. No lymphadenopathy, nodules, masses.  NEURO/ PSYCH:  No neurologic signs such as weakness numbness. No depression anxiety. IMM/ Allergy: No unusual infections.  Allergy .   REST of 12 system review negative except as per HPI   Past Medical History:  Diagnosis Date  . No pertinent past medical history     Past Surgical History:  Procedure Laterality Date  . NO PAST SURGERIES      No family history on file.  Social History   Socioeconomic History  . Marital status: Single    Spouse name: Not on file  . Number of children: Not on file  . Years of education: Not on file  . Highest education level: Not on file  Social Needs  . Financial resource strain: Not on file  . Food insecurity - worry: Not on file  . Food insecurity - inability: Not on file  . Transportation needs - medical: Not on file  . Transportation needs - non-medical: Not on file  Occupational History  . Not on file  Tobacco Use  . Smoking status: Never Smoker  Substance and Sexual Activity  . Alcohol use: No  . Drug use: No  . Sexual activity: Yes    Birth control/protection: None  Other Topics Concern  . Not on file  Social History Narrative  . Not on file    Outpatient Medications Prior to Visit    Medication Sig Dispense Refill  . acetaminophen (TYLENOL) 500 MG tablet Take 500 mg by mouth every 6 (six) hours as needed for pain.    . diphenhydrAMINE (BENADRYL) 25 MG tablet Take 25 mg by mouth every 6 (six) hours as needed for allergies.    Marland Kitchen. DM-Doxylamine-Acetaminophen (NYQUIL COLD & FLU PO) Take 2 capsules by mouth at bedtime as needed (For allergy symptoms).     . metroNIDAZOLE (FLAGYL) 500 MG tablet Take 1 tablet (500 mg total) by mouth 2 (two) times daily. 14 tablet 0  . valACYclovir (VALTREX) 1000 MG tablet Take 0.5 tablets (500 mg total) by mouth 2 (two) times daily. 7 tablet 99   No facility-administered medications prior to visit.      EXAM:  There were no vitals taken for this visit.  There is no height or weight on file to calculate BMI. Wt Readings from Last 3 Encounters:  03/02/15 169 lb 4 oz (76.8 kg)  06/27/12 143 lb (64.9 kg)  03/01/11 132 lb (59.9 kg)    Physical Exam: Vital signs reviewed ZOX:WRUEGEN:This is a well-developed well-nourished alert cooperative    who appearsr stated age in no acute distress.  HEENT: normocephalic atraumatic , Eyes: PERRL EOM's full, conjunctiva clear, Nares: paten,t no deformity discharge or tenderness., Ears: no deformity EAC's clear TMs with normal landmarks. Mouth: clear OP, no lesions, edema.  Moist mucous  membranes. Dentition in adequate repair. NECK: supple without masses, thyromegaly or bruits. CHEST/PULM:  Clear to auscultation and percussion breath sounds equal no wheeze , rales or rhonchi. No chest wall deformities or tenderness. Breast: normal by inspection . No dimpling, discharge, masses, tenderness or discharge . CV: PMI is nondisplaced, S1 S2 no gallops, murmurs, rubs. Peripheral pulses are full without delay.No JVD .  ABDOMEN: Bowel sounds normal nontender  No guard or rebound, no hepato splenomegal no CVA tenderness.  No hernia. Extremtities:  No clubbing cyanosis or edema, no acute joint swelling or redness no focal  atrophy NEURO:  Oriented x3, cranial nerves 3-12 appear to be intact, no obvious focal weakness,gait within normal limits no abnormal reflexes or asymmetrical SKIN: No acute rashes normal turgor, color, no bruising or petechiae. PSYCH: Oriented, good eye contact, no obvious depression anxiety, cognition and judgment appear normal. LN: no cervical axillary inguinal adenopathy  Lab Results  Component Value Date   WBC 7.5 06/27/2012   HGB 13.5 06/27/2012   HCT 39.8 06/27/2012   PLT 334 06/27/2012    BP Readings from Last 3 Encounters:  03/02/15 109/68  04/21/14 124/79  06/27/12 113/67    Lab results reviewed with patient   ASSESSMENT AND PLAN:  Discussed the following assessment and plan:  Encounter to establish care  No care team member to display There are no Patient Instructions on file for this visit.  Neta MendsWanda K. Panosh M.D.

## 2017-01-01 ENCOUNTER — Ambulatory Visit: Payer: BLUE CROSS/BLUE SHIELD | Admitting: Internal Medicine

## 2017-03-11 NOTE — Progress Notes (Deleted)
No chief complaint on file.   HPI: Patient  Kimberly Santos  27 y.o. comes in today for new patient  Care visit  Previous PCP  There are no preventive care reminders to display for this patient. Health Maintenance Review LIFESTYLE:  Exercise:   Tobacco/ETS: Alcohol:  Sugar beverages: Sleep: Drug use: no HH of  Work:    ROS:  GEN/ HEENT: No fever, significant weight changes sweats headaches vision problems hearing changes, CV/ PULM; No chest pain shortness of breath cough, syncope,edema  change in exercise tolerance. GI /GU: No adominal pain, vomiting, change in bowel habits. No blood in the stool. No significant GU symptoms. SKIN/HEME: ,no acute skin rashes suspicious lesions or bleeding. No lymphadenopathy, nodules, masses.  NEURO/ PSYCH:  No neurologic signs such as weakness numbness. No depression anxiety. IMM/ Allergy: No unusual infections.  Allergy .   REST of 12 system review negative except as per HPI   Past Medical History:  Diagnosis Date  . No pertinent past medical history     Past Surgical History:  Procedure Laterality Date  . NO PAST SURGERIES      No family history on file.  Social History   Socioeconomic History  . Marital status: Single    Spouse name: Not on file  . Number of children: Not on file  . Years of education: Not on file  . Highest education level: Not on file  Social Needs  . Financial resource strain: Not on file  . Food insecurity - worry: Not on file  . Food insecurity - inability: Not on file  . Transportation needs - medical: Not on file  . Transportation needs - non-medical: Not on file  Occupational History  . Not on file  Tobacco Use  . Smoking status: Never Smoker  Substance and Sexual Activity  . Alcohol use: No  . Drug use: No  . Sexual activity: Yes    Birth control/protection: None  Other Topics Concern  . Not on file  Social History Narrative  . Not on file    Outpatient Medications Prior to  Visit  Medication Sig Dispense Refill  . acetaminophen (TYLENOL) 500 MG tablet Take 500 mg by mouth every 6 (six) hours as needed for pain.    . diphenhydrAMINE (BENADRYL) 25 MG tablet Take 25 mg by mouth every 6 (six) hours as needed for allergies.    Marland Kitchen DM-Doxylamine-Acetaminophen (NYQUIL COLD & FLU PO) Take 2 capsules by mouth at bedtime as needed (For allergy symptoms).     . metroNIDAZOLE (FLAGYL) 500 MG tablet Take 1 tablet (500 mg total) by mouth 2 (two) times daily. 14 tablet 0  . valACYclovir (VALTREX) 1000 MG tablet Take 0.5 tablets (500 mg total) by mouth 2 (two) times daily. 7 tablet 99   No facility-administered medications prior to visit.      EXAM:  There were no vitals taken for this visit.  There is no height or weight on file to calculate BMI. Wt Readings from Last 3 Encounters:  03/02/15 169 lb 4 oz (76.8 kg)  06/27/12 143 lb (64.9 kg)  03/01/11 132 lb (59.9 kg)    Physical Exam: Vital signs reviewed BJY:NWGN is a well-developed well-nourished alert cooperative    who appearsr stated age in no acute distress.  HEENT: normocephalic atraumatic , Eyes: PERRL EOM's full, conjunctiva clear, Nares: paten,t no deformity discharge or tenderness., Ears: no deformity EAC's clear TMs with normal landmarks. Mouth: clear OP, no lesions, edema.  Moist mucous membranes. Dentition in adequate repair. NECK: supple without masses, thyromegaly or bruits. CHEST/PULM:  Clear to auscultation and percussion breath sounds equal no wheeze , rales or rhonchi. No chest wall deformities or tenderness. Breast: normal by inspection . No dimpling, discharge, masses, tenderness or discharge . CV: PMI is nondisplaced, S1 S2 no gallops, murmurs, rubs. Peripheral pulses are full without delay.No JVD .  ABDOMEN: Bowel sounds normal nontender  No guard or rebound, no hepato splenomegal no CVA tenderness.  No hernia. Extremtities:  No clubbing cyanosis or edema, no acute joint swelling or redness no  focal atrophy NEURO:  Oriented x3, cranial nerves 3-12 appear to be intact, no obvious focal weakness,gait within normal limits no abnormal reflexes or asymmetrical SKIN: No acute rashes normal turgor, color, no bruising or petechiae. PSYCH: Oriented, good eye contact, no obvious depression anxiety, cognition and judgment appear normal. LN: no cervical axillary inguinal adenopathy  Lab Results  Component Value Date   WBC 7.5 06/27/2012   HGB 13.5 06/27/2012   HCT 39.8 06/27/2012   PLT 334 06/27/2012    BP Readings from Last 3 Encounters:  03/02/15 109/68  04/21/14 124/79  06/27/12 113/67    Lab results reviewed with patient   ASSESSMENT AND PLAN:  Discussed the following assessment and plan:  No diagnosis found.  No care team member to display There are no Patient Instructions on file for this visit.  Neta MendsWanda K. Larell Baney M.D.

## 2017-03-12 ENCOUNTER — Ambulatory Visit: Payer: Self-pay | Admitting: Internal Medicine

## 2017-11-03 ENCOUNTER — Other Ambulatory Visit: Payer: Self-pay

## 2017-11-03 ENCOUNTER — Emergency Department (HOSPITAL_BASED_OUTPATIENT_CLINIC_OR_DEPARTMENT_OTHER)
Admission: EM | Admit: 2017-11-03 | Discharge: 2017-11-03 | Disposition: A | Discharge status: Home / Self Care | Payer: 59 | Attending: Emergency Medicine | Admitting: Emergency Medicine

## 2017-11-03 ENCOUNTER — Emergency Department (HOSPITAL_BASED_OUTPATIENT_CLINIC_OR_DEPARTMENT_OTHER): Payer: 59

## 2017-11-03 ENCOUNTER — Encounter (HOSPITAL_BASED_OUTPATIENT_CLINIC_OR_DEPARTMENT_OTHER): Payer: Self-pay

## 2017-11-03 DIAGNOSIS — R1030 Lower abdominal pain, unspecified: Secondary | ICD-10-CM | POA: Diagnosis present

## 2017-11-03 DIAGNOSIS — N760 Acute vaginitis: Secondary | ICD-10-CM | POA: Diagnosis not present

## 2017-11-03 DIAGNOSIS — N83201 Unspecified ovarian cyst, right side: Secondary | ICD-10-CM | POA: Diagnosis not present

## 2017-11-03 DIAGNOSIS — B9689 Other specified bacterial agents as the cause of diseases classified elsewhere: Secondary | ICD-10-CM

## 2017-11-03 HISTORY — DX: Herpesviral infection, unspecified: B00.9

## 2017-11-03 LAB — CBC WITH DIFFERENTIAL/PLATELET
BASOS ABS: 0 10*3/uL (ref 0.0–0.1)
Basophils Relative: 0 %
Eosinophils Absolute: 0.2 10*3/uL (ref 0.0–0.7)
Eosinophils Relative: 2 %
HCT: 42.4 % (ref 36.0–46.0)
HEMOGLOBIN: 14.3 g/dL (ref 12.0–15.0)
LYMPHS PCT: 32 %
Lymphs Abs: 3 10*3/uL (ref 0.7–4.0)
MCH: 27.3 pg (ref 26.0–34.0)
MCHC: 33.7 g/dL (ref 30.0–36.0)
MCV: 81.1 fL (ref 78.0–100.0)
Monocytes Absolute: 0.7 10*3/uL (ref 0.1–1.0)
Monocytes Relative: 8 %
NEUTROS PCT: 58 %
Neutro Abs: 5.2 10*3/uL (ref 1.7–7.7)
Platelets: 276 10*3/uL (ref 150–400)
RBC: 5.23 MIL/uL — AB (ref 3.87–5.11)
RDW: 13 % (ref 11.5–15.5)
WBC: 9.1 10*3/uL (ref 4.0–10.5)

## 2017-11-03 LAB — BASIC METABOLIC PANEL
Anion gap: 12 (ref 5–15)
BUN: 14 mg/dL (ref 6–20)
CHLORIDE: 105 mmol/L (ref 98–111)
CO2: 22 mmol/L (ref 22–32)
Calcium: 8.9 mg/dL (ref 8.9–10.3)
Creatinine, Ser: 0.93 mg/dL (ref 0.44–1.00)
GFR calc non Af Amer: >60 mL/min (ref >60)
Glucose, Bld: 91 mg/dL (ref 70–99)
POTASSIUM: 3.7 mmol/L (ref 3.5–5.1)
SODIUM: 139 mmol/L (ref 135–145)

## 2017-11-03 LAB — WET PREP, GENITAL
Sperm: NONE SEEN
Trich, Wet Prep: NONE SEEN
Yeast Wet Prep HPF POC: NONE SEEN

## 2017-11-03 LAB — URINALYSIS, ROUTINE W REFLEX MICROSCOPIC
BILIRUBIN URINE: NEGATIVE
GLUCOSE, UA: NEGATIVE mg/dL
Hgb urine dipstick: NEGATIVE
KETONES UR: NEGATIVE mg/dL
Leukocytes, UA: NEGATIVE
Nitrite: NEGATIVE
PH: 6.5 (ref 5.0–8.0)
Protein, ur: NEGATIVE mg/dL
Specific Gravity, Urine: 1.015 (ref 1.005–1.030)

## 2017-11-03 LAB — PREGNANCY, URINE: Preg Test, Ur: NEGATIVE

## 2017-11-03 MED ORDER — METRONIDAZOLE 500 MG PO TABS: 500.0000 mg | ORAL_TABLET | Freq: Two times a day (BID) | ORAL | 0 refills | Status: DC

## 2017-11-03 MED ORDER — METRONIDAZOLE 500 MG PO TABS
500.0000 mg | ORAL_TABLET | Freq: Once | ORAL | Status: AC
  Administered 2017-11-03: 500 mg via ORAL
  Filled 2017-11-03: qty 1

## 2017-11-03 MED ORDER — SODIUM CHLORIDE 0.9 % IV BOLUS: 1000.0000 mL | Freq: Once | INTRAVENOUS | Status: DC

## 2017-11-03 MED ORDER — ACETAMINOPHEN 325 MG PO TABS
650.0000 mg | ORAL_TABLET | Freq: Once | ORAL | Status: AC
  Administered 2017-11-03: 650 mg via ORAL
  Filled 2017-11-03: qty 2

## 2017-11-03 MED FILL — metroNIDAZOLE 500 MG TABS: 500 | 7 days supply | Qty: 14 | Fill #0

## 2017-11-03 NOTE — ED Notes (Signed)
Pt instructed to remain NPO until further notice 

## 2017-11-03 NOTE — Discharge Instructions (Addendum)
Take flagyl twice daily for a week for bacterial vaginosis.   Take tylenol, motrin for pain.   Practice safe sex, use condoms.   You have a complex ovarian cyst. Follow up [facility] in several weeks for repeat ultrasound   Return to ER if you have worse abdominal pain, vomiting, fever, vaginal discharge.

## 2017-11-03 NOTE — ED Triage Notes (Signed)
C/o vaginal pain and RLQ pain that started this am after intercourse-[facility] and advised to come to ED to r/o appendicitis-NAD-steady gait

## 2017-11-03 NOTE — ED Provider Notes (Signed)
  Physical Exam  BP (!) 105/59 (BP Location: Right Arm)   Pulse 66   Temp 99.3 F (37.4 C) (Oral)   Resp 14   Ht 5\' 5"  (1.651 m)   Wt 84.8 kg   LMP 10/11/2017   SpO2 100%   BMI 31.12 kg/m   Physical Exam  ED Course/Procedures     Procedures  MDM  Care assumed at 3 pm. Patient has RLQ pain after having intercourse. Sign out pending Pelvic US, labs.   4:04 PM Labs unremarkable. Wet prep + Clue cells. US showed complex R ovarian cyst, likely causing her pain. Will dc home with flagyl. She will need to [facility] in several weeks.       Charlynne PanderYao, David Hsienta, MD 11/03/17 [phone]

## 2017-11-03 NOTE — ED Provider Notes (Signed)
[facility] EMERGENCY DEPARTMENT Provider Note   CSN: [id number] Arrival date & time: 11/03/17  1150     History   Chief Complaint Chief Complaint  Patient presents with  . Vaginal Pain    HPI Kimberly Santos is a 27 y.o. female.  HPI  27 year old female presents with lower abdominal pain.  Occurred this morning while having intercourse.  She made her partner stop and since then the pain has been coming and going.  It is diffuse and lower abdominal and seems to wax and wane.  Certain positions such as laying flat make it worse.  No fever, vomiting, vaginal bleeding or urinary symptoms.  Has irregular menstrual cycles.  Past Medical History:  Diagnosis Date  . Herpes    takes medication when she has a flare up  . No pertinent past medical history     There are no active problems to display for this patient.   Past Surgical History:  Procedure Laterality Date  . NO PAST SURGERIES       OB History    Gravida  3   Para  1   Term  1   Preterm      AB  2   Living  1     SAB      TAB  2   Ectopic      Multiple      Live Births  1            Home Medications    Prior to Admission medications   Medication Sig Start Date End Date Taking? Authorizing Provider  acetaminophen (TYLENOL) 500 MG tablet Take 500 mg by mouth every 6 (six) hours as needed for pain.    [provider]  diphenhydrAMINE (BENADRYL) 25 MG tablet Take 25 mg by mouth every 6 (six) hours as needed for allergies.    [provider]  DM-Doxylamine-Acetaminophen (NYQUIL COLD & FLU PO) Take 2 capsules by mouth at bedtime as needed (For allergy symptoms).     [provider]  metroNIDAZOLE (FLAGYL) 500 MG tablet Take 1 tablet (500 mg total) by mouth 2 (two) times daily. 06/27/12   Key, Verita Schneiders, NP  valACYclovir (VALTREX) 1000 MG tablet Take 0.5 tablets (500 mg total) by mouth 2 (two) times daily. 04/21/14   Reuben Likes, MD    Family History No  family history on file.  Social History Social History   Tobacco Use  . Smoking status: Never Smoker  . Smokeless tobacco: Never Used  Substance Use Topics  . Alcohol use: No  . Drug use: No     Allergies   Penicillins   Review of Systems Review of Systems  Constitutional: Negative for fever.  Gastrointestinal: Positive for abdominal pain. Negative for vomiting.  Genitourinary: Negative for dysuria, menstrual problem, vaginal bleeding and vaginal discharge.  Musculoskeletal: Negative for back pain.  All other systems reviewed and are negative.    Physical Exam Updated Vital Signs BP (!) 105/59 (BP Location: Right Arm)   Pulse 66   Temp 99.3 F (37.4 C) (Oral)   Resp 14   Ht 5\' 5"  (1.651 m)   Wt 84.8 kg   LMP 10/11/2017   SpO2 100%   BMI 31.12 kg/m   Physical Exam  Constitutional: She is oriented to person, place, and time. She appears well-developed and well-nourished.  HENT:  Head: Normocephalic and atraumatic.  Right Ear: External ear normal.  Left Ear: External ear  normal.  Nose: Nose normal.  Eyes: Right eye exhibits no discharge. Left eye exhibits no discharge.  Cardiovascular: Normal rate, regular rhythm and normal heart sounds.  Pulmonary/Chest: Effort normal and breath sounds normal.  Abdominal: Soft. There is tenderness in the right lower quadrant, suprapubic area and left lower quadrant.  Genitourinary: Uterus is tender. Cervix exhibits motion tenderness. Cervix exhibits no discharge and no friability. No bleeding in the vagina. No vaginal discharge found.  Genitourinary Comments: No obvious hematoma or laceration in vagina. Palpation and movement of cervix causes diffuse lower abdominal pain.  Neurological: She is alert and oriented to person, place, and time.  Skin: Skin is warm and dry.  Nursing note and vitals reviewed.    ED Treatments / Results  Labs (all labs ordered are listed, but only abnormal results are displayed) Labs Reviewed    WET PREP, GENITAL - Abnormal; Notable for the following components:      Result Value   Clue Cells Wet Prep HPF POC PRESENT (*)    WBC, Wet Prep HPF POC FEW (*)    All other components within normal limits  PREGNANCY, URINE  URINALYSIS, ROUTINE W REFLEX MICROSCOPIC  BASIC METABOLIC PANEL  CBC WITH DIFFERENTIAL/PLATELET  GC/CHLAMYDIA PROBE AMP ([place]) NOT AT [facility]    EKG None  Radiology No results found.  Procedures Procedures (including critical care time)  Medications Ordered in ED Medications  sodium chloride 0.9 % bolus 1,000 mL (has no administration in time range)  acetaminophen (TYLENOL) tablet 650 mg (650 mg Oral Given 11/03/17 1319)     Initial Impression / Assessment and Plan / ED Course  I have reviewed the triage vital signs and the nursing notes.  Pertinent labs & imaging results that were available during my care of the patient were reviewed by me and considered in my medical decision making (see chart for details).     Patient is quite tender, though diffusely across lower abdomen. At first, this appears to be induced from intercourse but now her significant other has come in and indicated that she is been having the lower abdominal pain for a few days.  Some nausea without vomiting.  Thus I will obtain lab work in addition to the ultrasound and if the lab work is significantly abnormal or the ultrasound does not reveal pathology, she may need CT abdomen pelvis.  Care to Dr. Silverio LayYao.  Final Clinical Impressions(s) / ED Diagnoses   Final diagnoses:  None    ED Discharge Orders    None       Pricilla LovelessGoldston, Tamer Baughman, MD 11/03/17 1512

## 2017-11-04 ENCOUNTER — Telehealth: Payer: Self-pay | Admitting: Student

## 2017-11-04 DIAGNOSIS — A749 Chlamydial infection, unspecified: Secondary | ICD-10-CM

## 2017-11-04 LAB — GC/CHLAMYDIA PROBE AMP (~~LOC~~) NOT AT ARMC
Chlamydia: POSITIVE — AB
NEISSERIA GONORRHEA: NEGATIVE

## 2017-11-04 MED ORDER — AZITHROMYCIN 500 MG PO TABS: 1000.0000 mg | ORAL_TABLET | Freq: Once | ORAL | 0 refills | Status: AC

## 2017-11-04 NOTE — Telephone Encounter (Addendum)
[facility] tested positive for  Chlamydia. Patient was called by RN and allergies and pharmacy confirmed. Rx sent to pharmacy of choice.   Judeth HornLawrence, Bennetta Rudden, NP 11/04/2017 8:22 PM       ----- Message from Kathe BectonLori S Berdik, RN sent at 11/04/2017  5:49 PM EDT ----- This patient tested positive for:  Chlamydia  She ,"is allergic to ##Penicillin.", I have informed the patient of her results and confirmed her pharmacy is correct in her chart. Please send Rx.   Thank you,   Kathe BectonBerdik, Lori S, RN   [facility]

## 2017-11-05 ENCOUNTER — Other Ambulatory Visit: Payer: Self-pay | Admitting: Obstetrics and Gynecology

## 2017-11-05 DIAGNOSIS — R102 Pelvic and perineal pain: Secondary | ICD-10-CM

## 2017-11-12 ENCOUNTER — Ambulatory Visit
Admission: RE | Admit: 2017-11-12 | Discharge: 2017-11-12 | Disposition: A | Discharge status: Home / Self Care | Payer: 59 | Source: Ambulatory Visit | Attending: Obstetrics and Gynecology | Admitting: Obstetrics and Gynecology

## 2017-11-12 DIAGNOSIS — R102 Pelvic and perineal pain: Secondary | ICD-10-CM

## 2017-11-12 MED ORDER — IOPAMIDOL (ISOVUE-300) INJECTION 61%
100.0000 mL | Freq: Once | INTRAVENOUS | Status: AC | PRN
  Administered 2017-11-12: 100 mL via INTRAVENOUS

## 2018-02-09 ENCOUNTER — Inpatient Hospital Stay (HOSPITAL_COMMUNITY): Payer: 59

## 2018-02-09 ENCOUNTER — Inpatient Hospital Stay (HOSPITAL_COMMUNITY)
Admission: AD | Admit: 2018-02-09 | Discharge: 2018-02-09 | Disposition: A | Discharge status: Home / Self Care | Payer: 59 | Source: Ambulatory Visit | Attending: Obstetrics & Gynecology | Admitting: Obstetrics & Gynecology

## 2018-02-09 ENCOUNTER — Encounter (HOSPITAL_COMMUNITY): Payer: Self-pay | Admitting: *Deleted

## 2018-02-09 ENCOUNTER — Other Ambulatory Visit: Payer: Self-pay

## 2018-02-09 DIAGNOSIS — B9689 Other specified bacterial agents as the cause of diseases classified elsewhere: Secondary | ICD-10-CM | POA: Diagnosis not present

## 2018-02-09 DIAGNOSIS — Z79899 Other long term (current) drug therapy: Secondary | ICD-10-CM | POA: Insufficient documentation

## 2018-02-09 DIAGNOSIS — N760 Acute vaginitis: Secondary | ICD-10-CM

## 2018-02-09 DIAGNOSIS — O3680X0 Pregnancy with inconclusive fetal viability, not applicable or unspecified: Secondary | ICD-10-CM

## 2018-02-09 DIAGNOSIS — Z880 Allergy status to penicillin: Secondary | ICD-10-CM | POA: Diagnosis not present

## 2018-02-09 DIAGNOSIS — O23591 Infection of other part of genital tract in pregnancy, first trimester: Secondary | ICD-10-CM | POA: Insufficient documentation

## 2018-02-09 DIAGNOSIS — R1030 Lower abdominal pain, unspecified: Secondary | ICD-10-CM | POA: Diagnosis present

## 2018-02-09 DIAGNOSIS — Z792 Long term (current) use of antibiotics: Secondary | ICD-10-CM | POA: Insufficient documentation

## 2018-02-09 DIAGNOSIS — R109 Unspecified abdominal pain: Secondary | ICD-10-CM

## 2018-02-09 DIAGNOSIS — Z3A01 Less than 8 weeks gestation of pregnancy: Secondary | ICD-10-CM | POA: Diagnosis not present

## 2018-02-09 DIAGNOSIS — O020 Blighted ovum and nonhydatidiform mole: Secondary | ICD-10-CM

## 2018-02-09 DIAGNOSIS — O26891 Other specified pregnancy related conditions, first trimester: Secondary | ICD-10-CM | POA: Insufficient documentation

## 2018-02-09 LAB — CBC
HCT: 41 % (ref 36.0–46.0)
Hemoglobin: 13.5 g/dL (ref 12.0–15.0)
MCH: 27.9 pg (ref 26.0–34.0)
MCHC: 32.9 g/dL (ref 30.0–36.0)
MCV: 84.7 fL (ref 80.0–100.0)
Platelets: 347 10*3/uL (ref 150–400)
RBC: 4.84 MIL/uL (ref 3.87–5.11)
RDW: 13.5 % (ref 11.5–15.5)
WBC: 10.1 10*3/uL (ref 4.0–10.5)
nRBC: 0 % (ref 0.0–0.2)

## 2018-02-09 LAB — WET PREP, GENITAL
Sperm: NONE SEEN
Trich, Wet Prep: NONE SEEN
Yeast Wet Prep HPF POC: NONE SEEN

## 2018-02-09 LAB — URINALYSIS, ROUTINE W REFLEX MICROSCOPIC
Bilirubin Urine: NEGATIVE
GLUCOSE, UA: NEGATIVE mg/dL
HGB URINE DIPSTICK: NEGATIVE
KETONES UR: NEGATIVE mg/dL
Nitrite: NEGATIVE
Protein, ur: NEGATIVE mg/dL
Specific Gravity, Urine: 1.021 (ref 1.005–1.030)
pH: 6 (ref 5.0–8.0)

## 2018-02-09 LAB — POCT PREGNANCY, URINE: Preg Test, Ur: POSITIVE — AB

## 2018-02-09 LAB — HCG, QUANTITATIVE, PREGNANCY: hCG, Beta Chain, Quant, S: 298 m[IU]/mL — ABNORMAL HIGH (ref <5)

## 2018-02-09 MED ORDER — METRONIDAZOLE 500 MG PO TABS: 500.0000 mg | ORAL_TABLET | Freq: Two times a day (BID) | ORAL | 0 refills | Status: DC

## 2018-02-09 NOTE — MAU Note (Addendum)
Just cramping really bad and she has some d/c feels like her period is going to start. It's not blood. Pt is preg, +HPT last wk. preg confirmed at [facility] last wk.  Has appt with OB in Jan.

## 2018-02-09 NOTE — MAU Provider Note (Signed)
History     CSN: [id number]  Arrival date and time: 02/09/18 4098   First Provider Initiated Contact with Patient 02/09/18 1908      Chief Complaint  Patient presents with  . Abdominal Pain  . Possible Pregnancy   Kimberly Santos is a 27 y.o. G4P1 at [redacted]w[redacted]d by LMP who presents to MAU with complaints of abdominal pain and vaginal discharge. She reports abdominal pain started occurring on Sunday night, describes abdominal pain as lower abdominal cramping, rates 8/10- has not taken any medication for abdominal pain. She denies vaginal bleeding but reports discharge is associated with abdominal pain. Reports discharge has been occurring since this past Friday. Describes discharge as white thin discharge with odor, hx of recurrent BV. She denies nausea/vomiting or urinary symptoms. Patient reports NOB scheduled for 02/24/18 at [facility].    OB History    Gravida  4   Para  1   Term  1   Preterm      AB  2   Living  1     SAB      TAB  2   Ectopic      Multiple      Live Births  1           Past Medical History:  Diagnosis Date  . Herpes    takes medication when she has a flare up  . No pertinent past medical history     Past Surgical History:  Procedure Laterality Date  . NO PAST SURGERIES      History reviewed. No pertinent family history.  Social History   Tobacco Use  . Smoking status: Never Smoker  . Smokeless tobacco: Never Used  Substance Use Topics  . Alcohol use: No  . Drug use: No    Allergies:  Allergies  Allergen Reactions  . Penicillins Other (See Comments)    Childhood allergy; reaction unknown    Medications Prior to Admission  Medication Sig Dispense Refill Last Dose  . acetaminophen (TYLENOL) 500 MG tablet Take 500 mg by mouth every 6 (six) hours as needed for pain.   06/26/2012 at Unknown  . diphenhydrAMINE (BENADRYL) 25 MG tablet Take 25 mg by mouth every 6 (six) hours as needed for allergies.   06/26/2012 at Unknown  .  DM-Doxylamine-Acetaminophen (NYQUIL COLD & FLU PO) Take 2 capsules by mouth at bedtime as needed (For allergy symptoms).    06/26/2012 at Unknown  . metroNIDAZOLE (FLAGYL) 500 MG tablet Take 1 tablet (500 mg total) by mouth 2 (two) times daily. One po bid x 7 days 14 tablet 0   . valACYclovir (VALTREX) 1000 MG tablet Take 0.5 tablets (500 mg total) by mouth 2 (two) times daily. 7 tablet 99     Review of Systems  Constitutional: Negative.   Respiratory: Negative.   Cardiovascular: Negative.   Gastrointestinal: Positive for abdominal pain. Negative for constipation, diarrhea, nausea and vomiting.  Genitourinary: Positive for vaginal discharge. Negative for difficulty urinating, dysuria, frequency, hematuria, pelvic pain, urgency and vaginal bleeding.  Musculoskeletal: Negative.    Physical Exam   Blood pressure 123/65, pulse 80, temperature 98.3 F (36.8 C), temperature source Oral, resp. rate 17, weight 88.8 kg, last menstrual period 01/04/2018, SpO2 100 %.  Physical Exam  Nursing note and vitals reviewed. Constitutional: She is oriented to person, place, and time. She appears well-developed and well-nourished. No distress.  Cardiovascular: Normal rate, regular rhythm and normal heart sounds.  Respiratory: Effort normal and breath sounds  normal. No respiratory distress. She has no wheezes. She has no rales.  GI: Bowel sounds are normal. She exhibits no distension. There is abdominal tenderness. There is no rebound.  Tenderness in lower left quadrant- no rebound or guarding   Genitourinary:    Vaginal discharge present.     Genitourinary Comments: White thin discharge with odor    Neurological: She is alert and oriented to person, place, and time.    MAU Course  Procedures  MDM Orders Placed This Encounter  Procedures  . Wet prep, genital  . US OB LESS THAN 14 WEEKS WITH OB TRANSVAGINAL  . Urinalysis, Routine w reflex microscopic  . hCG, quantitative, pregnancy  . CBC  .  Pregnancy, urine POC   Results for orders placed or performed during the hospital encounter of 02/09/18 (from the past 24 hour(s))  Urinalysis, Routine w reflex microscopic     Status: Abnormal   Collection Time: 02/09/18  7:01 PM  Result Value Ref Range   Color, Urine YELLOW YELLOW   APPearance HAZY (A) CLEAR   Specific Gravity, Urine 1.021 1.005 - 1.030   pH 6.0 5.0 - 8.0   Glucose, UA NEGATIVE NEGATIVE mg/dL   Hgb urine dipstick NEGATIVE NEGATIVE   Bilirubin Urine NEGATIVE NEGATIVE   Ketones, ur NEGATIVE NEGATIVE mg/dL   Protein, ur NEGATIVE NEGATIVE mg/dL   Nitrite NEGATIVE NEGATIVE   Leukocytes, UA MODERATE (A) NEGATIVE   RBC / HPF 0-5 0 - 5 RBC/hpf   WBC, UA 0-5 0 - 5 WBC/hpf   Bacteria, UA FEW (A) NONE SEEN   Squamous Epithelial / LPF 6-10 0 - 5  Pregnancy, urine POC     Status: Abnormal   Collection Time: 02/09/18  7:07 PM  Result Value Ref Range   Preg Test, Ur POSITIVE (A) NEGATIVE  Wet prep, genital     Status: Abnormal   Collection Time: 02/09/18  7:16 PM  Result Value Ref Range   Yeast Wet Prep HPF POC NONE SEEN NONE SEEN   Trich, Wet Prep NONE SEEN NONE SEEN   Clue Cells Wet Prep HPF POC PRESENT (A) NONE SEEN   WBC, Wet Prep HPF POC FEW (A) NONE SEEN   Sperm NONE SEEN   hCG, quantitative, pregnancy     Status: Abnormal   Collection Time: 02/09/18  7:23 PM  Result Value Ref Range   hCG, Beta Chain, Quant, S 298 (H) <5 mIU/mL  CBC     Status: None   Collection Time: 02/09/18  7:23 PM  Result Value Ref Range   WBC 10.1 4.0 - 10.5 K/uL   RBC 4.84 3.87 - 5.11 MIL/uL   Hemoglobin 13.5 12.0 - 15.0 g/dL   HCT 16.141.0 09.636.0 - 04.546.0 %   MCV 84.7 80.0 - 100.0 fL   MCH 27.9 26.0 - 34.0 pg   MCHC 32.9 30.0 - 36.0 g/dL   RDW 40.913.5 81.111.5 - 91.415.5 %   Platelets 347 150 - 400 K/uL   nRBC 0.0 0.0 - 0.2 %   [place] Ob Less Than 14 Weeks With Ob Transvaginal  Result Date: 02/09/2018 CLINICAL DATA:  Abdominal pain in the first trimester pregnancy. EXAM: OBSTETRIC <14 WK US AND  TRANSVAGINAL OB US TECHNIQUE: Both transabdominal and transvaginal ultrasound examinations were performed for complete evaluation of the gestation as well as the maternal uterus, adnexal regions, and pelvic cul-de-sac. Transvaginal technique was performed to assess early pregnancy. COMPARISON:  11/03/2017 ultrasound FINDINGS: Intrauterine gestational sac: Single Yolk sac:  Not Visualized. Embryo:  Not Visualized. Cardiac Activity: Not applicable Heart Rate: Not applicable MSD: 2.4 mm   4 w   6 d Subchorionic hemorrhage:  None visualized. Maternal uterus/adnexae: Corpus luteum on the right normal left ovary. IMPRESSION: Tiny anechoic 2.4 mm focus within the endometrial cavity may represent an early intrauterine gestational sac, but no yolk sac, fetal pole, or cardiac activity are yet visualized. Recommend follow-up quantitative B-HCG levels and follow-up US in 14 days to assess viability. This recommendation follows SRU consensus guidelines: Diagnostic Criteria for Nonviable Pregnancy Early in the First Trimester. Malva Limes Engl J Med 2013; 161:0960-45; 369:1443-51. Electronically Signed   By: Tollie Ethavid  Kwon M.D.   On: 02/09/2018 20:24   Discussed results of labs and US with patient. Rx for Flagyl sent to pharmacy for treatment of recurrent BV. Educated and discussed precautions with patient and reasons to return to MAU. Follow up in office of [facility] for repeat HCG this week- patient aware to call office on Thursday morning to schedule. Pt stable at time of discharge.   Assessment and Plan   1. Empty gestational sac with ongoing pregnancy   2. Abdominal pain during pregnancy in first trimester   3. Bacterial vaginosis    Discharge home  Follow up for repeat HCG in office on Thursday  Return to MAU as needed  Rx for Flagyl sent to pharmacy of choice   Follow-up Information    [facility]. Schedule an appointment as soon as possible for a visit.   Specialty:  Obstetrics and Gynecology Why:  Make  appointment to be seen on 12/26 for repeat HCG  Contact information: [address] [place] [phone]          Allergies as of 02/09/2018      Reactions   Penicillins Other (See Comments)   Childhood allergy; reaction unknown      Medication List    TAKE these medications   acetaminophen 500 MG tablet Commonly known as:  TYLENOL Take 500 mg by mouth every 6 (six) hours as needed for pain.   diphenhydrAMINE 25 MG tablet Commonly known as:  BENADRYL Take 25 mg by mouth every 6 (six) hours as needed for allergies.   metroNIDAZOLE 500 MG tablet Commonly known as:  FLAGYL Take 1 tablet (500 mg total) by mouth 2 (two) times daily. One po bid x 7 days What changed:  Another medication with the same name was added. Make sure you understand how and when to take each.   metroNIDAZOLE 500 MG tablet Commonly known as:  FLAGYL Take 1 tablet (500 mg total) by mouth 2 (two) times daily. What changed:  You were already taking a medication with the same name, and this prescription was added. Make sure you understand how and when to take each.   NYQUIL COLD & FLU PO Take 2 capsules by mouth at bedtime as needed (For allergy symptoms).   valACYclovir 1000 MG tablet Commonly known as:  VALTREX Take 0.5 tablets (500 mg total) by mouth 2 (two) times daily.       Sharyon CableVeronica C Minnah Llamas CNM 02/09/2018, 8:44 PM

## 2018-02-10 LAB — GC/CHLAMYDIA PROBE AMP (~~LOC~~) NOT AT ARMC
Chlamydia: NEGATIVE
Neisseria Gonorrhea: NEGATIVE

## 2018-02-18 NOTE — L&D Delivery Note (Addendum)
Patient: Kimberly Santos MRN: [id number]  GBS status: Unknown, IAP given (Ancef/azithromycin)   Patient is a 28 y.o. now [id number] s/p NSVD at [redacted]w[redacted]d, who was admitted for PPROM. SROM 20h 8m prior to delivery with clear fluid.    Delivery Note At 10:03 PM a viable female was delivered via Vaginal, Spontaneous (Presentation: R OA).  APGAR: 4; 7 weight pending.   Placenta status: Spontaneous intact, 20% clot burden signifying likely abruption Cord: 3 vessel, intact with the following complications: none.  Cord pH: 7.27   Anesthesia: Epidural   Episiotomy: None Lacerations: None Est. Blood Loss (mL): 743ml   Delivery call to NICU made prior to delivery. Head delivered right OA. No nuchal cord present. Shoulder and body delivered in usual fashion. Infant with delayed but spontaneous cry, placed on mother's abdomen, dried and bulb suctioned. Cord clamped x 2 after brief delay, and cut by family member. Infant given to NICU team for further evaluation and resuscitation. Cord blood drawn. Placenta delivered spontaneously with gentle cord traction. Fundus firm with massage and Pitocin. Perineum inspected and found to have no lacerations.  Mom to postpartum.  Baby to NICU.  Patriciaann Clan 08/25/2018, 10:23 PM  I confirm that I have verified the information documented in the resident's note and that I have also personally reperformed the physical exam and all medical decision making activities.  I was gloved and present for entire delivery SVD without incident.  Small abruption noted at delivery (approx 20%), baby attended to by NICU team, states baby is doing well for GA No difficulty with shoulders No lacerations  Seabron Spates, CNM  Please schedule this patient for Postpartum visit in: 4 weeks with the following provider: Any provider For C/S patients schedule nurse incision check in weeks 2 weeks: no Low risk pregnancy complicated by: Preterm labor and PPROM Delivery mode:   SVD Anticipated Birth Control:  other/unsure PP Procedures needed: none  Schedule Integrated BH visit: no

## 2018-02-26 ENCOUNTER — Other Ambulatory Visit: Payer: Self-pay | Admitting: Obstetrics and Gynecology

## 2018-02-26 DIAGNOSIS — N925 Other specified irregular menstruation: Secondary | ICD-10-CM

## 2018-03-26 ENCOUNTER — Inpatient Hospital Stay (HOSPITAL_COMMUNITY)
Admission: AD | Admit: 2018-03-26 | Discharge: 2018-03-26 | Disposition: A | Discharge status: Home / Self Care | Payer: 59 | Attending: Obstetrics & Gynecology | Admitting: Obstetrics & Gynecology

## 2018-03-26 ENCOUNTER — Encounter (HOSPITAL_COMMUNITY): Payer: Self-pay | Admitting: *Deleted

## 2018-03-26 DIAGNOSIS — Z3A11 11 weeks gestation of pregnancy: Secondary | ICD-10-CM | POA: Insufficient documentation

## 2018-03-26 DIAGNOSIS — O219 Vomiting of pregnancy, unspecified: Secondary | ICD-10-CM

## 2018-03-26 DIAGNOSIS — Z880 Allergy status to penicillin: Secondary | ICD-10-CM | POA: Insufficient documentation

## 2018-03-26 DIAGNOSIS — O210 Mild hyperemesis gravidarum: Secondary | ICD-10-CM | POA: Diagnosis present

## 2018-03-26 LAB — URINALYSIS, ROUTINE W REFLEX MICROSCOPIC
BILIRUBIN URINE: NEGATIVE
Glucose, UA: NEGATIVE mg/dL
Hgb urine dipstick: NEGATIVE
Ketones, ur: 15 mg/dL — AB
NITRITE: NEGATIVE
PH: 7 (ref 5.0–8.0)
Protein, ur: NEGATIVE mg/dL

## 2018-03-26 LAB — COMPREHENSIVE METABOLIC PANEL
ALBUMIN: 3.3 g/dL — AB (ref 3.5–5.0)
ALT: 11 U/L (ref 0–44)
AST: 13 U/L — ABNORMAL LOW (ref 15–41)
Alkaline Phosphatase: 39 U/L (ref 38–126)
Anion gap: 6 (ref 5–15)
BUN: 11 mg/dL (ref 6–20)
CO2: 22 mmol/L (ref 22–32)
Calcium: 8.9 mg/dL (ref 8.9–10.3)
Chloride: 106 mmol/L (ref 98–111)
Creatinine, Ser: 0.74 mg/dL (ref 0.44–1.00)
GFR calc Af Amer: >60 mL/min (ref >60)
GFR calc non Af Amer: >60 mL/min (ref >60)
Glucose, Bld: 118 mg/dL — ABNORMAL HIGH (ref 70–99)
POTASSIUM: 3.5 mmol/L (ref 3.5–5.1)
Sodium: 134 mmol/L — ABNORMAL LOW (ref 135–145)
Total Bilirubin: 0.7 mg/dL (ref 0.3–1.2)
Total Protein: 6.3 g/dL — ABNORMAL LOW (ref 6.5–8.1)

## 2018-03-26 LAB — URINALYSIS, MICROSCOPIC (REFLEX)

## 2018-03-26 LAB — CBC WITH DIFFERENTIAL/PLATELET
Basophils Absolute: 0 10*3/uL (ref 0.0–0.1)
Basophils Relative: 0 %
Eosinophils Absolute: 0.1 10*3/uL (ref 0.0–0.5)
Eosinophils Relative: 1 %
HCT: 40.6 % (ref 36.0–46.0)
Hemoglobin: 13.7 g/dL (ref 12.0–15.0)
LYMPHS PCT: 27 %
Lymphs Abs: 2.3 10*3/uL (ref 0.7–4.0)
MCH: 27.7 pg (ref 26.0–34.0)
MCHC: 33.7 g/dL (ref 30.0–36.0)
MCV: 82.2 fL (ref 80.0–100.0)
Monocytes Absolute: 0.4 10*3/uL (ref 0.1–1.0)
Monocytes Relative: 5 %
Neutro Abs: 5.7 10*3/uL (ref 1.7–7.7)
Neutrophils Relative %: 67 %
Platelets: 304 10*3/uL (ref 150–400)
RBC: 4.94 MIL/uL (ref 3.87–5.11)
RDW: 13 % (ref 11.5–15.5)
WBC: 8.5 10*3/uL (ref 4.0–10.5)
nRBC: 0 % (ref 0.0–0.2)

## 2018-03-26 MED ORDER — LACTATED RINGERS IV BOLUS
1000.0000 mL | Freq: Once | INTRAVENOUS | Status: AC
  Administered 2018-03-26: 1000 mL via INTRAVENOUS

## 2018-03-26 MED ORDER — ONDANSETRON HCL 4 MG PO TABS: 4.0000 mg | ORAL_TABLET | Freq: Three times a day (TID) | ORAL | 1 refills | Status: DC | PRN

## 2018-03-26 MED ORDER — DOXYLAMINE-PYRIDOXINE 10-10 MG PO TBEC: 1.0000 | DELAYED_RELEASE_TABLET | Freq: Every day | ORAL | 1 refills | Status: DC

## 2018-03-26 MED ORDER — ONDANSETRON HCL 4 MG/2ML IJ SOLN
4.0000 mg | Freq: Once | INTRAMUSCULAR | Status: AC
  Administered 2018-03-26: 4 mg via INTRAVENOUS
  Filled 2018-03-26: qty 2

## 2018-03-26 NOTE — MAU Note (Signed)
Unable to void at this time, drinking water. (bottle half empty). Returned to lobby

## 2018-03-26 NOTE — Discharge Instructions (Signed)

## 2018-03-26 NOTE — MAU Provider Note (Signed)
History     CSN: [id number]  Arrival date and time: 03/26/18 1132   None     Chief Complaint  Patient presents with  . Emesis  . Dizziness   Patient reports she had nausea and vomiting starting two weeks ago. She reports having a good appetite but vomiting her meals about 1 hour after eating. She reports it has been worsening over time and she has not been able to keep anything down since Monday. She has not tried any medication for her nausea and vomiting. She denies other associated symptoms. She reports feeling dehydrated and not urinating regularly, she was initially unable to leave urine sample in MAU.   OB History    Gravida  4   Para  1   Term  1   Preterm      AB  2   Living  1     SAB      TAB  2   Ectopic      Multiple      Live Births  1           Past Medical History:  Diagnosis Date  . Herpes    takes medication when she has a flare up  . No pertinent past medical history     Past Surgical History:  Procedure Laterality Date  . NO PAST SURGERIES      No family history on file.  Social History   Tobacco Use  . Smoking status: Never Smoker  . Smokeless tobacco: Never Used  Substance Use Topics  . Alcohol use: No  . Drug use: No    Allergies:  Allergies  Allergen Reactions  . Penicillins Other (See Comments)    Childhood allergy; reaction unknown    Medications Prior to Admission  Medication Sig Dispense Refill Last Dose  . acetaminophen (TYLENOL) 500 MG tablet Take 500 mg by mouth every 6 (six) hours as needed for pain.   06/26/2012 at Unknown  . diphenhydrAMINE (BENADRYL) 25 MG tablet Take 25 mg by mouth every 6 (six) hours as needed for allergies.   06/26/2012 at Unknown  . DM-Doxylamine-Acetaminophen (NYQUIL COLD & FLU PO) Take 2 capsules by mouth at bedtime as needed (For allergy symptoms).    06/26/2012 at Unknown  . metroNIDAZOLE (FLAGYL) 500 MG tablet Take 1 tablet (500 mg total) by mouth 2 (two) times daily. One po bid x 7  days 14 tablet 0   . metroNIDAZOLE (FLAGYL) 500 MG tablet Take 1 tablet (500 mg total) by mouth 2 (two) times daily. 14 tablet 0   . valACYclovir (VALTREX) 1000 MG tablet Take 0.5 tablets (500 mg total) by mouth 2 (two) times daily. 7 tablet 99     Review of Systems  Constitutional: Positive for appetite change.  Gastrointestinal: Positive for nausea and vomiting. Negative for abdominal pain and diarrhea.  Genitourinary: Positive for decreased urine volume.  Neurological: Positive for dizziness.  All other systems reviewed and are negative.  Physical Exam   Vitals:   03/26/18 1148 03/26/18 1149  BP:  104/71  Pulse:  78  Resp:  17  Temp:  98.3 F (36.8 C)  TempSrc:  Oral  SpO2: 100% 100%  Weight:  86.3 kg   Results for orders placed or performed during the hospital encounter of 03/26/18 (from the past 24 hour(s))  CBC with Differential/Platelet     Status: None   Collection Time: 03/26/18 12:38 PM  Result Value Ref Range   WBC 8.5  4.0 - 10.5 K/uL   RBC 4.94 3.87 - 5.11 MIL/uL   Hemoglobin 13.7 12.0 - 15.0 g/dL   HCT 16.140.6 09.636.0 - 04.546.0 %   MCV 82.2 80.0 - 100.0 fL   MCH 27.7 26.0 - 34.0 pg   MCHC 33.7 30.0 - 36.0 g/dL   RDW 40.913.0 81.111.5 - 91.415.5 %   Platelets 304 150 - 400 K/uL   nRBC 0.0 0.0 - 0.2 %   Neutrophils Relative % 67 %   Neutro Abs 5.7 1.7 - 7.7 K/uL   Lymphocytes Relative 27 %   Lymphs Abs 2.3 0.7 - 4.0 K/uL   Monocytes Relative 5 %   Monocytes Absolute 0.4 0.1 - 1.0 K/uL   Eosinophils Relative 1 %   Eosinophils Absolute 0.1 0.0 - 0.5 K/uL   Basophils Relative 0 %   Basophils Absolute 0.0 0.0 - 0.1 K/uL  Comprehensive metabolic panel     Status: Abnormal   Collection Time: 03/26/18  1:59 PM  Result Value Ref Range   Sodium 134 (L) 135 - 145 mmol/L   Potassium 3.5 3.5 - 5.1 mmol/L   Chloride 106 98 - 111 mmol/L   CO2 22 22 - 32 mmol/L   Glucose, Bld 118 (H) 70 - 99 mg/dL   BUN 11 6 - 20 mg/dL   Creatinine, Ser 7.820.74 0.44 - 1.00 mg/dL   Calcium 8.9 8.9 -  95.610.3 mg/dL   Total Protein 6.3 (L) 6.5 - 8.1 g/dL   Albumin 3.3 (L) 3.5 - 5.0 g/dL   AST 13 (L) 15 - 41 U/L   ALT 11 0 - 44 U/L   Alkaline Phosphatase 39 38 - 126 U/L   Total Bilirubin 0.7 0.3 - 1.2 mg/dL   GFR calc non Af Amer >60 >60 mL/min   GFR calc Af Amer >60 >60 mL/min   Anion gap 6 5 - 15  Urinalysis, Routine w reflex microscopic     Status: Abnormal   Collection Time: 03/26/18  2:16 PM  Result Value Ref Range   Color, Urine YELLOW YELLOW   APPearance CLEAR CLEAR   Specific Gravity, Urine <1.005 (L) 1.005 - 1.030   pH 7.0 5.0 - 8.0   Glucose, UA NEGATIVE NEGATIVE mg/dL   Hgb urine dipstick NEGATIVE NEGATIVE   Bilirubin Urine NEGATIVE NEGATIVE   Ketones, ur 15 (A) NEGATIVE mg/dL   Protein, ur NEGATIVE NEGATIVE mg/dL   Nitrite NEGATIVE NEGATIVE   Leukocytes, UA SMALL (A) NEGATIVE  Urinalysis, Microscopic (reflex)     Status: Abnormal   Collection Time: 03/26/18  2:16 PM  Result Value Ref Range   RBC / HPF 0-5 0 - 5 RBC/hpf   WBC, UA 0-5 0 - 5 WBC/hpf   Bacteria, UA FEW (A) NONE SEEN   Squamous Epithelial / LPF 0-5 0 - 5   Mucus PRESENT      Physical Exam  Nursing note and vitals reviewed. Constitutional: She is oriented to person, place, and time. She appears well-developed and well-nourished. No distress.  HENT:  Head: Normocephalic and atraumatic.  Eyes: Pupils are equal, round, and reactive to light.  Cardiovascular: Normal rate, regular rhythm and normal heart sounds.  Respiratory: Effort normal and breath sounds normal.  GI: Soft. Bowel sounds are normal. She exhibits no distension. There is no abdominal tenderness.  Musculoskeletal: Normal range of motion.  Neurological: She is alert and oriented to person, place, and time.  Skin: Skin is warm and dry. She is not diaphoretic.  Psychiatric:  She has a normal mood and affect. Her behavior is normal. Judgment and thought content normal.    MAU Course  Procedures IV Fluid bolus CBC/CMP Urinalysis    MDM Patient appears well, smiling and laughing with partner. She had just eaten some fried chicken when I entered room. She denies nausea after receiving IV Zofran and 1 liter LR bolus. Patient's symptoms and physical exam consistent with nausea and vomiting of pregnancy. CBC, CMP, and urinalysis unremarkable. FHTs audible by doppler, 176 BPM. Patient has continued to feel well despite eating fried chicken. Discussed going home with Diclegis QHS and Zofran PRN for nausea and vomiting. Discussed BRATTY diet and explained why fast food was not easy to digest and may contribute to her symptoms. Patient to follow up with [facility] at next scheduled antenatal visit. Nausea and vomiting of pregnancy precautions discussed and handout given. If patient feels she is unable to tolerate PO food or fluid again, she is to call office or present to MAU.   Assessment and Plan  28 y.o. G4P1 at [redacted]w[redacted]d Nausea and vomiting of pregnancy Audible FHTs Diclegis QHS Zofran PRN nausea and vomiting  F/u at next antenatal appointment Nausea and vomiting precautions given Discharged home in stable condition   Kimberly Santos 03/26/2018, 1:58 PM

## 2018-03-26 NOTE — MAU Note (Signed)
So sick, dizzy.  Been throwing up since Monday. Has not taken anything for it.  Finally called dr yesterday.

## 2018-04-19 LAB — OB RESULTS CONSOLE GC/CHLAMYDIA
Chlamydia: NEGATIVE
Gonorrhea: NEGATIVE

## 2018-04-19 LAB — OB RESULTS CONSOLE HIV ANTIBODY (ROUTINE TESTING): HIV: NONREACTIVE

## 2018-04-19 LAB — OB RESULTS CONSOLE RPR: RPR: NONREACTIVE

## 2018-04-19 LAB — OB RESULTS CONSOLE HEPATITIS B SURFACE ANTIGEN: Hepatitis B Surface Ag: NEGATIVE

## 2018-04-19 LAB — OB RESULTS CONSOLE RUBELLA ANTIBODY, IGM: Rubella: NON-IMMUNE/NOT IMMUNE

## 2018-05-20 ENCOUNTER — Other Ambulatory Visit (HOSPITAL_COMMUNITY): Payer: Self-pay

## 2018-05-20 DIAGNOSIS — Z3A20 20 weeks gestation of pregnancy: Secondary | ICD-10-CM

## 2018-05-20 DIAGNOSIS — Z3689 Encounter for other specified antenatal screening: Secondary | ICD-10-CM

## 2018-06-03 ENCOUNTER — Ambulatory Visit (HOSPITAL_COMMUNITY)
Admission: RE | Admit: 2018-06-03 | Discharge: 2018-06-03 | Disposition: A | Discharge status: Home / Self Care | Payer: 59 | Source: Ambulatory Visit | Attending: Obstetrics and Gynecology | Admitting: Obstetrics and Gynecology

## 2018-06-03 ENCOUNTER — Other Ambulatory Visit: Payer: Self-pay

## 2018-06-03 DIAGNOSIS — O99212 Obesity complicating pregnancy, second trimester: Secondary | ICD-10-CM | POA: Diagnosis not present

## 2018-06-03 DIAGNOSIS — Z3A20 20 weeks gestation of pregnancy: Secondary | ICD-10-CM | POA: Insufficient documentation

## 2018-06-03 DIAGNOSIS — Z3689 Encounter for other specified antenatal screening: Secondary | ICD-10-CM | POA: Diagnosis not present

## 2018-06-03 DIAGNOSIS — Z363 Encounter for antenatal screening for malformations: Secondary | ICD-10-CM

## 2018-07-23 LAB — OB RESULTS CONSOLE RPR: RPR: NONREACTIVE

## 2018-08-25 ENCOUNTER — Inpatient Hospital Stay (HOSPITAL_COMMUNITY): Payer: No Typology Code available for payment source | Admitting: Anesthesiology

## 2018-08-25 ENCOUNTER — Inpatient Hospital Stay (HOSPITAL_COMMUNITY)
Admission: AD | Admit: 2018-08-25 | Discharge: 2018-08-27 | DRG: 805 | Disposition: A | Discharge status: Home / Self Care | Payer: No Typology Code available for payment source | Attending: Family Medicine | Admitting: Family Medicine

## 2018-08-25 ENCOUNTER — Inpatient Hospital Stay (HOSPITAL_COMMUNITY): Payer: No Typology Code available for payment source

## 2018-08-25 ENCOUNTER — Other Ambulatory Visit: Payer: Self-pay

## 2018-08-25 ENCOUNTER — Encounter (HOSPITAL_COMMUNITY): Payer: Self-pay

## 2018-08-25 DIAGNOSIS — Z880 Allergy status to penicillin: Secondary | ICD-10-CM | POA: Diagnosis not present

## 2018-08-25 DIAGNOSIS — O368130 Decreased fetal movements, third trimester, not applicable or unspecified: Secondary | ICD-10-CM | POA: Diagnosis present

## 2018-08-25 DIAGNOSIS — O42919 Preterm premature rupture of membranes, unspecified as to length of time between rupture and onset of labor, unspecified trimester: Secondary | ICD-10-CM | POA: Diagnosis present

## 2018-08-25 DIAGNOSIS — O4290 Premature rupture of membranes, unspecified as to length of time between rupture and onset of labor, unspecified weeks of gestation: Secondary | ICD-10-CM

## 2018-08-25 DIAGNOSIS — O4593 Premature separation of placenta, unspecified, third trimester: Secondary | ICD-10-CM | POA: Diagnosis present

## 2018-08-25 DIAGNOSIS — Z3A33 33 weeks gestation of pregnancy: Secondary | ICD-10-CM | POA: Diagnosis not present

## 2018-08-25 DIAGNOSIS — O42013 Preterm premature rupture of membranes, onset of labor within 24 hours of rupture, third trimester: Secondary | ICD-10-CM | POA: Diagnosis present

## 2018-08-25 DIAGNOSIS — Z3A32 32 weeks gestation of pregnancy: Secondary | ICD-10-CM | POA: Diagnosis not present

## 2018-08-25 DIAGNOSIS — Z1159 Encounter for screening for other viral diseases: Secondary | ICD-10-CM

## 2018-08-25 LAB — CBC
HCT: 36.1 % (ref 36.0–46.0)
Hemoglobin: 12.2 g/dL (ref 12.0–15.0)
MCH: 27.6 pg (ref 26.0–34.0)
MCHC: 33.8 g/dL (ref 30.0–36.0)
MCV: 81.7 fL (ref 80.0–100.0)
Platelets: 271 10*3/uL (ref 150–400)
RBC: 4.42 MIL/uL (ref 3.87–5.11)
RDW: 14.2 % (ref 11.5–15.5)
WBC: 12.1 10*3/uL — ABNORMAL HIGH (ref 4.0–10.5)
nRBC: 0 % (ref 0.0–0.2)

## 2018-08-25 LAB — POCT FERN TEST: POCT Fern Test: POSITIVE

## 2018-08-25 LAB — TYPE AND SCREEN
ABO/RH(D): O POS
Antibody Screen: NEGATIVE

## 2018-08-25 LAB — SARS CORONAVIRUS 2 BY RT PCR (HOSPITAL ORDER, PERFORMED IN ~~LOC~~ HOSPITAL LAB): SARS Coronavirus 2: NEGATIVE

## 2018-08-25 LAB — ABO/RH: ABO/RH(D): O POS

## 2018-08-25 MED ORDER — CEFAZOLIN SODIUM-DEXTROSE 1-4 GM/50ML-% IV SOLN
1.0000 g | Freq: Three times a day (TID) | INTRAVENOUS | Status: DC
  Administered 2018-08-25 (×3): 1 g via INTRAVENOUS
  Filled 2018-08-25 (×4): qty 50

## 2018-08-25 MED ORDER — ONDANSETRON HCL 4 MG/2ML IJ SOLN: 4.0000 mg | Freq: Four times a day (QID) | INTRAMUSCULAR | Status: DC | PRN

## 2018-08-25 MED ORDER — METOCLOPRAMIDE HCL 5 MG/ML IJ SOLN
5.0000 mg | Freq: Once | INTRAMUSCULAR | Status: AC
  Administered 2018-08-25: 5 mg via INTRAVENOUS
  Filled 2018-08-25: qty 2

## 2018-08-25 MED ORDER — EPHEDRINE 5 MG/ML INJ
10.0000 mg | INTRAVENOUS | Status: DC | PRN
  Filled 2018-08-25: qty 2

## 2018-08-25 MED ORDER — SODIUM CHLORIDE 0.9% FLUSH
3.0000 mL | Freq: Two times a day (BID) | INTRAVENOUS | Status: DC
  Administered 2018-08-26 (×2): 3 mL via INTRAVENOUS

## 2018-08-25 MED ORDER — MAGNESIUM SULFATE 40 G IN LACTATED RINGERS - SIMPLE
INTRAVENOUS | Status: AC
  Filled 2018-08-25: qty 500

## 2018-08-25 MED ORDER — DIPHENHYDRAMINE HCL 50 MG/ML IJ SOLN: 12.5000 mg | INTRAMUSCULAR | Status: DC | PRN

## 2018-08-25 MED ORDER — ONDANSETRON HCL 4 MG/2ML IJ SOLN
4.0000 mg | Freq: Four times a day (QID) | INTRAMUSCULAR | Status: DC | PRN
  Administered 2018-08-25: 18:00:00 4 mg via INTRAVENOUS
  Filled 2018-08-25: qty 2

## 2018-08-25 MED ORDER — PRENATAL MULTIVITAMIN CH
1.0000 | ORAL_TABLET | Freq: Every day | ORAL | Status: DC
  Administered 2018-08-26: 1 via ORAL
  Filled 2018-08-25: qty 1

## 2018-08-25 MED ORDER — MAGNESIUM SULFATE BOLUS VIA INFUSION
4.0000 g | Freq: Once | INTRAVENOUS | Status: AC
  Administered 2018-08-25: 04:00:00 4 g via INTRAVENOUS
  Filled 2018-08-25: qty 500

## 2018-08-25 MED ORDER — ONDANSETRON HCL 4 MG/2ML IJ SOLN: 4.0000 mg | INTRAMUSCULAR | Status: DC | PRN

## 2018-08-25 MED ORDER — MAGNESIUM SULFATE 40 G IN LACTATED RINGERS - SIMPLE: 2.0000 g/h | INTRAVENOUS | Status: DC

## 2018-08-25 MED ORDER — LACTATED RINGERS IV SOLN
INTRAVENOUS | Status: DC
  Administered 2018-08-25: 14:00:00 via INTRAVENOUS

## 2018-08-25 MED ORDER — FENTANYL CITRATE (PF) 100 MCG/2ML IJ SOLN: 100.0000 ug | INTRAMUSCULAR | Status: DC | PRN

## 2018-08-25 MED ORDER — FENTANYL-BUPIVACAINE-NACL 0.5-0.125-0.9 MG/250ML-% EP SOLN
12.0000 mL/h | EPIDURAL | Status: DC | PRN
  Filled 2018-08-25: qty 250

## 2018-08-25 MED ORDER — OXYTOCIN BOLUS FROM INFUSION
500.0000 mL | Freq: Once | INTRAVENOUS | Status: AC
  Administered 2018-08-25: 22:00:00 500 mL via INTRAVENOUS

## 2018-08-25 MED ORDER — LIDOCAINE HCL (PF) 1 % IJ SOLN
INTRAMUSCULAR | Status: DC | PRN
  Administered 2018-08-25: 10 mL via EPIDURAL

## 2018-08-25 MED ORDER — SODIUM CHLORIDE 0.9% FLUSH: 3.0000 mL | INTRAVENOUS | Status: DC | PRN

## 2018-08-25 MED ORDER — LIDOCAINE HCL (PF) 1 % IJ SOLN: 30.0000 mL | INTRAMUSCULAR | Status: DC | PRN

## 2018-08-25 MED ORDER — DIPHENHYDRAMINE HCL 25 MG PO CAPS: 25.0000 mg | ORAL_CAPSULE | Freq: Four times a day (QID) | ORAL | Status: DC | PRN

## 2018-08-25 MED ORDER — SIMETHICONE 80 MG PO CHEW: 80.0000 mg | CHEWABLE_TABLET | ORAL | Status: DC | PRN

## 2018-08-25 MED ORDER — BETAMETHASONE SOD PHOS & ACET 6 (3-3) MG/ML IJ SUSP
12.0000 mg | INTRAMUSCULAR | Status: DC
  Administered 2018-08-25: 12 mg via INTRAMUSCULAR
  Filled 2018-08-25 (×2): qty 2

## 2018-08-25 MED ORDER — DOCUSATE SODIUM 100 MG PO CAPS: 100.0000 mg | ORAL_CAPSULE | Freq: Every day | ORAL | Status: DC

## 2018-08-25 MED ORDER — SOD CITRATE-CITRIC ACID 500-334 MG/5ML PO SOLN: 30.0000 mL | ORAL | Status: DC | PRN

## 2018-08-25 MED ORDER — OXYTOCIN 40 UNITS IN NORMAL SALINE INFUSION - SIMPLE MED
2.5000 [IU]/h | INTRAVENOUS | Status: DC
  Filled 2018-08-25: qty 1000

## 2018-08-25 MED ORDER — SENNOSIDES-DOCUSATE SODIUM 8.6-50 MG PO TABS
2.0000 | ORAL_TABLET | ORAL | Status: DC
  Administered 2018-08-26 (×2): 2 via ORAL
  Filled 2018-08-25 (×2): qty 2

## 2018-08-25 MED ORDER — OXYCODONE-ACETAMINOPHEN 5-325 MG PO TABS: 2.0000 | ORAL_TABLET | ORAL | Status: DC | PRN

## 2018-08-25 MED ORDER — SODIUM CHLORIDE 0.9 % IV SOLN: 250.0000 mL | INTRAVENOUS | Status: DC | PRN

## 2018-08-25 MED ORDER — ACETAMINOPHEN 325 MG PO TABS
650.0000 mg | ORAL_TABLET | ORAL | Status: DC | PRN
  Administered 2018-08-25: 650 mg via ORAL
  Filled 2018-08-25: qty 2

## 2018-08-25 MED ORDER — ZOLPIDEM TARTRATE 5 MG PO TABS: 5.0000 mg | ORAL_TABLET | Freq: Every evening | ORAL | Status: DC | PRN

## 2018-08-25 MED ORDER — LACTATED RINGERS IV SOLN
500.0000 mL | Freq: Once | INTRAVENOUS | Status: AC
  Administered 2018-08-25: 18:00:00 500 mL via INTRAVENOUS

## 2018-08-25 MED ORDER — WITCH HAZEL-GLYCERIN EX PADS
1.0000 "application " | MEDICATED_PAD | CUTANEOUS | Status: DC | PRN
  Administered 2018-08-26: 1 via TOPICAL

## 2018-08-25 MED ORDER — PHENYLEPHRINE 40 MCG/ML (10ML) SYRINGE FOR IV PUSH (FOR BLOOD PRESSURE SUPPORT)
80.0000 ug | PREFILLED_SYRINGE | INTRAVENOUS | Status: DC | PRN
  Administered 2018-08-25: 18:00:00 80 ug via INTRAVENOUS
  Filled 2018-08-25: qty 10

## 2018-08-25 MED ORDER — PRENATAL MULTIVITAMIN CH
1.0000 | ORAL_TABLET | Freq: Every day | ORAL | Status: DC
  Administered 2018-08-25: 14:00:00 1 via ORAL
  Filled 2018-08-25: qty 1

## 2018-08-25 MED ORDER — DIBUCAINE (PERIANAL) 1 % EX OINT
1.0000 "application " | TOPICAL_OINTMENT | CUTANEOUS | Status: DC | PRN
  Administered 2018-08-26: 1 via RECTAL
  Filled 2018-08-25: qty 28

## 2018-08-25 MED ORDER — CEPHALEXIN 500 MG PO CAPS: 500.0000 mg | ORAL_CAPSULE | Freq: Four times a day (QID) | ORAL | Status: DC

## 2018-08-25 MED ORDER — TETANUS-DIPHTH-ACELL PERTUSSIS 5-2.5-18.5 LF-MCG/0.5 IM SUSP
0.5000 mL | Freq: Once | INTRAMUSCULAR | Status: AC
  Administered 2018-08-27: 0.5 mL via INTRAMUSCULAR
  Filled 2018-08-25: qty 0.5

## 2018-08-25 MED ORDER — ACETAMINOPHEN 325 MG PO TABS: 650.0000 mg | ORAL_TABLET | ORAL | Status: DC | PRN

## 2018-08-25 MED ORDER — AZITHROMYCIN 250 MG PO TABS
1000.0000 mg | ORAL_TABLET | Freq: Once | ORAL | Status: AC
  Administered 2018-08-25: 1000 mg via ORAL
  Filled 2018-08-25: qty 4

## 2018-08-25 MED ORDER — VITAMIN D 25 MCG (1000 UNIT) PO TABS
1000.0000 [IU] | ORAL_TABLET | Freq: Every day | ORAL | Status: DC
  Administered 2018-08-25: 1000 [IU] via ORAL
  Filled 2018-08-25: qty 1

## 2018-08-25 MED ORDER — LACTATED RINGERS IV SOLN
INTRAVENOUS | Status: DC
  Administered 2018-08-25: 04:00:00 via INTRAVENOUS

## 2018-08-25 MED ORDER — SODIUM CHLORIDE (PF) 0.9 % IJ SOLN
INTRAMUSCULAR | Status: DC | PRN
  Administered 2018-08-25: 12 mL/h via EPIDURAL

## 2018-08-25 MED ORDER — COCONUT OIL OIL: 1.0000 "application " | TOPICAL_OIL | Status: DC | PRN

## 2018-08-25 MED ORDER — ACETAMINOPHEN 325 MG PO TABS
650.0000 mg | ORAL_TABLET | ORAL | Status: DC | PRN
  Administered 2018-08-26: 650 mg via ORAL
  Filled 2018-08-25: qty 2

## 2018-08-25 MED ORDER — OXYCODONE-ACETAMINOPHEN 5-325 MG PO TABS: 1.0000 | ORAL_TABLET | ORAL | Status: DC | PRN

## 2018-08-25 MED ORDER — MEASLES, MUMPS & RUBELLA VAC IJ SOLR
0.5000 mL | Freq: Once | INTRAMUSCULAR | Status: AC
  Administered 2018-08-27: 0.5 mL via SUBCUTANEOUS
  Filled 2018-08-25: qty 0.5

## 2018-08-25 MED ORDER — IBUPROFEN 600 MG PO TABS
600.0000 mg | ORAL_TABLET | Freq: Four times a day (QID) | ORAL | Status: DC
  Administered 2018-08-26 – 2018-08-27 (×6): 600 mg via ORAL
  Filled 2018-08-25 (×6): qty 1

## 2018-08-25 MED ORDER — ONDANSETRON HCL 4 MG PO TABS: 4.0000 mg | ORAL_TABLET | ORAL | Status: DC | PRN

## 2018-08-25 MED ORDER — LACTATED RINGERS IV SOLN
INTRAVENOUS | Status: DC
  Administered 2018-08-25: 21:00:00 via INTRAVENOUS

## 2018-08-25 MED ORDER — BENZOCAINE-MENTHOL 20-0.5 % EX AERO
1.0000 "application " | INHALATION_SPRAY | CUTANEOUS | Status: DC | PRN
  Administered 2018-08-26: 1 via TOPICAL
  Filled 2018-08-25: qty 56

## 2018-08-25 MED ORDER — PHENYLEPHRINE 40 MCG/ML (10ML) SYRINGE FOR IV PUSH (FOR BLOOD PRESSURE SUPPORT)
80.0000 ug | PREFILLED_SYRINGE | INTRAVENOUS | Status: DC | PRN
  Filled 2018-08-25 (×2): qty 10

## 2018-08-25 MED ORDER — LACTATED RINGERS IV SOLN: 500.0000 mL | INTRAVENOUS | Status: DC | PRN

## 2018-08-25 MED ORDER — CALCIUM CARBONATE ANTACID 500 MG PO CHEW
2.0000 | CHEWABLE_TABLET | ORAL | Status: DC | PRN
  Administered 2018-08-25: 400 mg via ORAL
  Filled 2018-08-25: qty 2

## 2018-08-25 NOTE — Progress Notes (Signed)
Faculty Note  Asked by RN to see patient for contractions.  Patient reports she felt 5 contractions in a row every 5-6 min or so but then subsided. During conversation had another couple of contractions with ongoing leaking of fluid. Patient uncomfortable with contractions and breathing through them. Reports decreased fetal movement with contractions. Denies bleeding.  SVE: deferred Toco: no contractions on monitor but several witnessed, palpate mild  A/P: 28 yo G2P1001 @ [redacted]w[redacted]d admitted for PPROM. BTMZ #2 tonight. On MgSO4. On monitor for contractions, if continues will recheck cervix.   Reviewed plan with patient, will attempt to stop labor at this point, cont latency abx, cont steroids and MgSO4 but there is only so much that can be done to stop labor. Reviewed plan for IOL at 34 weeks if not in labor by then, risks/benefits. She verbalizes understanding of the above and is in agreement with plan.    NICU consult pending (discussed with Neonatology) Cont MgsO4 Cont BTMZ Latency abx   Feliz Beam, M.D. Attending Center for [employer])

## 2018-08-25 NOTE — Progress Notes (Signed)
Patient ID: Kimberly Santos, female   DOB: 12/18/1990, 28 y.o.   MRN: [id number] Feeling rectal pressure Nauseated  Vitals:   08/25/18 1836 08/25/18 1901 08/25/18 1931 08/25/18 2001  BP: (!) 98/59 (!) 88/72 108/62 (!) 113/53  Pulse: 93 91 (!) 207 (!) 184  Resp:    16  Temp:    98.1 F (36.7 C)  TempSrc:    Oral  SpO2:      Weight:      Height:       FHR stable and reassuring UCs every 2-3 min  Dilation: 8 Effacement (%): 100 Cervical Position: Anterior Station: 0, Plus 1 Presentation: Vertex Exam by:: Latrish Mogel cnm  Will continue to observe Has had zofran   WIll add reglan

## 2018-08-25 NOTE — Progress Notes (Signed)
Neonatologist contacted for consult.

## 2018-08-25 NOTE — Discharge Summary (Signed)
Postpartum Discharge Summary     Patient Name: Kimberly Santos DOB: 1990-10-24 MRN: [id number]  Date of admission: 08/25/2018 Delivering Provider: Patriciaann Clan   Date of discharge: 08/27/2018  Admitting diagnosis: water broke. 32 weeks Intrauterine pregnancy: [redacted]w[redacted]d     Secondary diagnosis:  Principal Problem:   Preterm labor with preterm delivery third trimester Active Problems:   Preterm premature rupture of membranes (PPROM) with unknown onset of labor  Additional problems: None     Discharge diagnosis: Preterm Pregnancy Delivered                                                                                                Post partum procedures:None  Augmentation: None   Complications: None  Hospital course:  Onset of Labor With Vaginal Delivery     28 y.o. yo [id number] at [redacted]w[redacted]d was admitted in Latent Labor on 08/25/2018. Patient had an uncomplicated labor course as follows:  She presented to MAU with PPROM on 08/25/2018 at 0130hrs.  She started having contractions shortly thereafter.  She was given Betamethasone and Magnesium Sulfate   She was also given latency antibiotics. NICU was consulted Membrane Rupture Time/Date: 1:30 AM ,08/25/2018   Intrapartum Procedures: Episiotomy: None [1]                                         Lacerations:  None [1]  Patient had a preterm delivery of a Viable infant at 2203 on 08/25/2018  Baby was taken to NICU Information for the patient's newborn:  Jaylenne, Hamelin [[id number]]    Patient had an uncomplicated postpartum course.  She is ambulating, tolerating a regular diet, passing flatus, and urinating well. Patient is discharged home in stable condition on 08/27/18.  Magnesium Sulfate recieved: Yes BMZ received: Yes  Physical exam  Vitals:   08/26/18 1758 08/26/18 1932 08/26/18 2331 08/27/18 0559  BP: (!) 111/58 (!) 101/54 (!) 100/56 106/70  Pulse: 66 72 79 91  Resp: 18 17 18 18   Temp: 98.2 F (36.8 C) 97.6 F (36.4 C)  97.6 F (36.4 C) 98 F (36.7 C)  TempSrc: Oral Oral Oral Oral  SpO2: 98% 100% 100% 99%  Weight:      Height:       General: alert, cooperative and no distress Lochia: appropriate Uterine Fundus: firm, NT DVT Evaluation: No evidence of DVT seen on physical exam. Negative Homan's sign. No cords or calf tenderness. No significant calf/ankle edema. Labs: Lab Results  Component Value Date   WBC 12.1 (H) 08/25/2018   HGB 12.2 08/25/2018   HCT 36.1 08/25/2018   MCV 81.7 08/25/2018   PLT 271 08/25/2018   CMP Latest Ref Rng & Units 03/26/2018  Glucose 70 - 99 mg/dL 118(H)  BUN 6 - 20 mg/dL 11  Creatinine 0.44 - 1.00 mg/dL 0.74  Sodium 135 - 145 mmol/L 134(L)  Potassium 3.5 - 5.1 mmol/L 3.5  Chloride 98 - 111 mmol/L 106  CO2 22 - 32 mmol/L 22  Calcium 8.9 - 10.3 mg/dL 8.9  Total Protein 6.5 - 8.1 g/dL 6.3(L)  Total Bilirubin 0.3 - 1.2 mg/dL 0.7  Alkaline Phos 38 - 126 U/L 39  AST 15 - 41 U/L 13(L)  ALT 0 - 44 U/L 11    Discharge instruction: per After Visit Summary and "Baby and Me Booklet".  After visit meds:  Allergies as of 08/27/2018      Reactions   Penicillins Other (See Comments)   Childhood allergy; reaction unknown      Medication List    STOP taking these medications   Doxylamine-Pyridoxine 10-10 MG Tbec Commonly known as: Diclegis   ondansetron 4 MG tablet Commonly known as: Zofran   valACYclovir 1000 MG tablet Commonly known as: Valtrex     TAKE these medications   cholecalciferol 25 MCG (1000 UT) tablet Commonly known as: VITAMIN D3 Take 1,000 Units by mouth daily.   ibuprofen 600 MG tablet Commonly known as: ADVIL Take 1 tablet (600 mg total) by mouth every 6 (six) hours as needed for moderate pain or cramping.   prenatal multivitamin Tabs tablet Take 1 tablet by mouth daily at 12 noon.       Diet: routine diet  Activity: Advance as tolerated. Pelvic rest for 6 weeks.   Outpatient follow up: 6 weeks Follow-up Information    [place]  [place] [facility] up in 6 week(s).   Why: Postpartum Care        [place] provider.  Newborn Data: Live born female  Birth Weight:   APGAR: 4, 7  Newborn Delivery   Birth date/time: 08/25/2018 22:03:00 Delivery type: Vaginal, Spontaneous     Baby Feeding: Breast Disposition:NICU   08/27/2018 Jaynie CollinsUgonna Yisroel Mullendore, MD

## 2018-08-25 NOTE — Progress Notes (Signed)
Pharmacy Antibiotic Note  Kimberly Santos is a 28 y.o. female admitted on 08/25/2018 with PPROM.  Pharmacy has been consulted for azithromycin, Ancef/Keflex dosing in PCN allergy. Pt reports hives with penicillins and no history of use of cephalosporins.  Plan: Azithromycin 1000 mg x 1 dose Cefazolin 1000 mg IV q 8 hours x 48 hours, followed by Cephalexin 500 mg PO QID x 5 days Monitor patient for allergic reactions    Temp (24hrs), Avg:97.7 F (36.5 C), Min:97.7 F (36.5 C), Max:97.7 F (36.5 C)  No results for input(s): WBC, CREATININE, LATICACIDVEN, VANCOTROUGH, VANCOPEAK, VANCORANDOM, GENTTROUGH, GENTPEAK, GENTRANDOM, TOBRATROUGH, TOBRAPEAK, TOBRARND, AMIKACINPEAK, AMIKACINTROU, AMIKACIN in the last 168 hours.  CrCl cannot be calculated (Patient's most recent lab result is older than the maximum 21 days allowed.).    Allergies  Allergen Reactions  . Penicillins Other (See Comments)    Childhood allergy; reaction unknown    Thank you for allowing pharmacy to be a part of this patient's care.  Jens Som, PharmD

## 2018-08-25 NOTE — Anesthesia Preprocedure Evaluation (Signed)

## 2018-08-25 NOTE — Progress Notes (Signed)
Patient transferred to labor and delivery report given to [place], RN.

## 2018-08-25 NOTE — Progress Notes (Signed)
Called by RN to check patient as she is rating contractions 9/10 now.   SVE: 3/80/-1 FHT: reactive  Likely early labor, transfer to L&D. Cont MgSO4, cont latency abx.   Dr. Nehemiah Settle and charge RN on L&D notified.   Feliz Beam, M.D. Attending Center for [employer] Fish farm manager)

## 2018-08-25 NOTE — H&P (Signed)
Kimberly Santos is a 28 y.o. female presenting for PPROM this morning at 0130. Patient reports she woke up from a sound sleep and noticed fluid all over her bed and pajamas. She reports occasional uncomfortable contractions. She denies vaginal bleeding, decreased fetal movement. She denies fever, falls, recent illness.  Prenatal History --PNC at [facility] in [place] (records requested) --Low risk pregnancy per patient --Hx Term SVD "almost 10 lbs" --No referrals or consults  OB History    Gravida  4   Para  1   Term  1   Preterm      AB  2   Living  1     SAB      TAB  2   Ectopic      Multiple      Live Births  1          Past Medical History:  Diagnosis Date  . Herpes    takes medication when she has a flare up  . No pertinent past medical history    Past Surgical History:  Procedure Laterality Date  . NO PAST SURGERIES     Family History: family history is not on file. Social History:  reports that she has never smoked. She has never used smokeless tobacco. She reports that she does not drink alcohol or use drugs.     Maternal Diabetes: No Genetic Screening: Normal Maternal Ultrasounds/Referrals: Normal Fetal Ultrasounds or other Referrals:  None Maternal Substance Abuse:  No Significant Maternal Medications:  None Significant Maternal Lab Results:  None Other Comments:  GBS Unknown  Review of Systems  Constitutional: Negative for fever.  Respiratory: Negative for cough and shortness of breath.   Gastrointestinal: Positive for abdominal pain.  Genitourinary: Negative for flank pain.  Musculoskeletal: Negative for back pain.  All other systems reviewed and are negative.  Dilation: 1 Effacement (%): 50 Station: Ballotable Exam by:: Rosine Abe, CNM Blood pressure 113/73, pulse 94, temperature 97.7 F (36.5 C), temperature source Oral, resp. rate 18, last menstrual period 01/04/2018, SpO2 100 %. Physical Exam  Nursing note and  vitals reviewed. Constitutional: She appears well-developed and well-nourished.  Cardiovascular: Normal rate.  Respiratory: Effort normal.    Prenatal labs: Records requested ABO, Rh:   O POS Antibody:   NEG Rubella:   RPR:    HBsAg:    HIV:    GBS:   Unknown  Fetal Surveillance --Reactive tracing: baseline 135, moderate variability, positive accels, no decels --Toco: occasional contractions, palpate mild, not felt by patient --Vertex by bedside US  Assessment/Plan: --28 y.o. [id number] at [redacted]w[redacted]d  --Grossly ruptured at 0130 today --Reactive tracing --1cm/thick/not laboring --Childhood PCN allergy (hives), s/p Pharmacy consult for PPROM abx --Per Dr. Rip Harbour, admit to [place], CNM 08/25/2018, 3:21 AM

## 2018-08-25 NOTE — MAU Note (Signed)
Pt reports she was sleeping and water broke at 0130, clear fluid. Reports some mild contractions that she has not timed. Denies bleeding. Reports like she feels like she needs to have a BM. Reports good fetal movement. Gets [place] at [facility] in [place].

## 2018-08-25 NOTE — Consult Note (Signed)
Neonatology Antenatal Consultation Requested: Kimberly Santos Reason: PPROM 33 weeks  US shows ~90%ile fetal weight for dates, no abnormalities.  I spoke with Kimberly Santos and explained to her and the FOB that babies born at 67 weeks often needed to be gavage fed, and may have some breathing irregularities that require monitoring.  I told them to expect a 3 week stay at a minimum for the baby.  She had questions about the feeding regimen and visitation policy for the father.  She was having increasing contraction frequency and intensity so I abbreviated the visit.  Despina Hidden, M.D.

## 2018-08-25 NOTE — Anesthesia Procedure Notes (Signed)
Epidural Patient location during procedure: OB Start time: 08/25/2018 5:37 PM End time: 08/25/2018 5:50 PM  Staffing Anesthesiologist: Lidia Collum, MD Performed: anesthesiologist   Preanesthetic Checklist Completed: patient identified, pre-op evaluation, timeout performed, IV checked, risks and benefits discussed and monitors and equipment checked  Epidural Patient position: sitting Prep: DuraPrep Patient monitoring: heart rate, continuous pulse ox and blood pressure Approach: midline Location: L3-L4 Injection technique: LOR air  Needle:  Needle type: Tuohy  Needle gauge: 17 G Needle length: 9 cm Needle insertion depth: 6 cm Catheter type: closed end flexible Catheter size: 19 Gauge Catheter at skin depth: 11 cm Test dose: negative  Assessment Events: blood not aspirated, injection not painful, no injection resistance, negative IV test and no paresthesia  Additional Notes Reason for block:procedure for pain

## 2018-08-26 LAB — RPR: RPR Ser Ql: NONREACTIVE

## 2018-08-26 NOTE — Progress Notes (Signed)
Faculty Attending Note  Post Partum Day 1  Subjective: Patient is feeling very well. She reports moderately well controlled pain on PO pain meds. She is ambulating and denies light-headedness or dizziness. She is passing flatus. She is tolerating a regular diet without nausea/vomiting. Bleeding is moderate. She is breast & bottle feeding. Baby is in NICU and doing well.  Objective: Blood pressure (!) 100/58, pulse 84, temperature 98.2 F (36.8 C), temperature source Oral, resp. rate 16, height 5\' 5"  (1.651 m), weight 86.8 kg, last menstrual period 01/04/2018, SpO2 94 %, unknown if currently breastfeeding. Temp:  [97.6 F (36.4 C)-98.9 F (37.2 C)] 98.2 F (36.8 C) (07/08 0800) Pulse Rate:  [73-207] 84 (07/08 0800) Resp:  [16-18] 16 (07/08 0800) BP: (72-123)/(47-83) 100/58 (07/08 0800) SpO2:  [94 %-100 %] 94 % (07/08 0800) Weight:  [86.8 kg] 86.8 kg (07/07 1718)  Physical Exam:  General: alert, oriented, cooperative Chest: normal respiratory effort Heart: RRR  Abdomen: soft, appropriately tender to palpation  Uterine Fundus: firm, 2 fingers below the umbilicus Lochia: moderate, rubra DVT Evaluation: no evidence of DVT Extremities: no edema, no calf tenderness  UOP: voiding spontaneously  Recent Labs    08/25/18 0244  HGB 12.2  HCT 36.1    Assessment/Plan: Patient Active Problem List   Diagnosis Date Noted  . Preterm premature rupture of membranes (PPROM) with unknown onset of labor 08/25/2018  . Preterm labor 08/25/2018    Patient is 28 y.o. [id number] PPD#1 s/p SVD at [redacted]w[redacted]d. She is doing very well, recovering appropriately and complains only of some pain. Baby is in NICU and doing well.    Continue routine post partum care Pain meds prn Regular diet undecided for birth control Plan for discharge tomorrow    K. Arvilla Meres, M.D. Attending Center for [employer] (Faculty Practice)  08/26/2018, 12:33 PM

## 2018-08-26 NOTE — Anesthesia Postprocedure Evaluation (Signed)
Anesthesia Post Note  Patient: Kimberly Santos  Procedure(s) Performed: AN AD HOC LABOR EPIDURAL     Patient location during evaluation: Mother Baby Anesthesia Type: Epidural Level of consciousness: awake and alert Pain management: pain level controlled Vital Signs Assessment: post-procedure vital signs reviewed and stable Respiratory status: spontaneous breathing, nonlabored ventilation and respiratory function stable Cardiovascular status: stable Postop Assessment: no headache, no backache and epidural receding Anesthetic complications: no Comments: Spoke with pt on the phone. No anesthetic complications noted.,    Last Vitals:  Vitals:   08/26/18 0520 08/26/18 0800  BP: (!) 94/47 (!) 100/58  Pulse: 86 84  Resp: 16 16  Temp: 36.6 C 36.8 C  SpO2: 98% 94%    Last Pain:  Vitals:   08/26/18 0800  TempSrc: Oral  PainSc:    Pain Goal: Patients Stated Pain Goal: 3 (08/26/18 0030)                 Riki Sheer

## 2018-08-26 NOTE — Lactation Note (Signed)
This note was copied from a baby's chart. Lactation Consultation Note  Patient Name: Kimberly Santos [id number] Date: 08/26/2018 Reason for consult: Initial assessment;NICU baby;Preterm <34wks  2101 - 2134 - I conducted an initial consult with Ms. Erber. She was pumping upon entry. She had a pump set up last night, and she has been pumping every 2-3 hours. Ms. Reither had pumped approximately 10 mls into her bottles.  I sized up her flange from a 24 to a 27. I also reviewed the pump settings. We discussed pump frequency, and I reviewed content from the breast feeding your baby in the NICU booklet. I encouraged her to track her pumps.  Ms. Ehrich has labels and storage bottles. She is taking her pumped milk to the NICU.   Ms. Ontko breast fed her first child for five years. She states that she had enough breast milk to donate. She did not pump frequently with her first. He was exclusively breast fed. Ms. Oliveri has ordered an Elvie pump, and she has obtained a spectra pump via insurance.  I provided Ms. Barclift with an additional wash basin and reviewed cleaning guidelines. She states that she had used the sink to clean her pump parts prior to my visit.   I also showed Ms. Gowans how to use the piston pump that comes with her pump kit.  No further questions at this time. Ms. Grist would like lactation follow up tomorrow.   Maternal Data Formula Feeding for Exclusion: No Has patient been taught Hand Expression?: Yes Does the patient have breastfeeding experience prior to this delivery?: Yes   Lactation Tools Discussed/Used Tools: Pump;Flanges Flange Size: 27 Breast pump type: Double-Electric Breast Pump Pump Review: Setup, frequency, and cleaning;Milk Storage Initiated by:: hl Date initiated:: 08/26/18   Consult Status Consult Status: Follow-up Date: 08/27/18 Follow-up type: In-patient    Lenore Manner 08/26/2018, 9:46 PM

## 2018-08-27 LAB — CULTURE, BETA STREP (GROUP B ONLY)

## 2018-08-27 MED ORDER — IBUPROFEN 600 MG PO TABS: 600.0000 mg | ORAL_TABLET | Freq: Four times a day (QID) | ORAL | 2 refills | Status: AC | PRN

## 2018-08-27 NOTE — Lactation Note (Signed)
This note was copied from a baby's chart. Lactation Consultation Note  Patient Name: Girl Alysabeth Scalia [id number] Date: 08/27/2018   Baby 73 hours old in NICU.  < 5 lbs. Mother states yesterday she felt she was pumping more than she is now but admits sleeping a lot. Discussed pumping schedule of q 2.5 hrs during the day and q 4 hours at night making sure to get at least one pumping session between 0100 and 0500 for a minimum of 8 sessions per day.  Mother wanted assistance w/ hand expression.  Good flow expressed. Suggest hand expression before and after pumping and demonstrated hands on pumping. Reviewed milk storage and engorgement care. Mother has personal DEBP at home and discussed using DEBP at bedside.       Maternal Data    Feeding Feeding Type: Donor Breast Milk  LATCH Score                   Interventions    Lactation Tools Discussed/Used     Consult Status      Carlye Grippe 08/27/2018, 10:10 AM

## 2018-08-27 NOTE — Plan of Care (Signed)
Discharged home with pre-eclampsia s/s

## 2018-08-27 NOTE — Progress Notes (Signed)
Patient screened out for psychosocial assessment since none of the following apply:  Psychosocial stressors documented in mother or baby's chart  Gestation less than 32 weeks  Code at delivery   Infant with anomalies Please contact the Clinical Social Worker if specific needs arise, by MOB's request, or if MOB scores greater than 9/yes to question 10 on Edinburgh Postpartum Depression Screen.  Justis Dupas, LCSW Clinical Social Worker [facility] Cell#: [phone]     

## 2018-08-27 NOTE — Discharge Instructions (Signed)

## 2018-08-28 ENCOUNTER — Ambulatory Visit: Payer: Self-pay

## 2018-08-28 NOTE — Lactation Note (Signed)
This note was copied from a baby's chart. Lactation Consultation Note  Patient Name: Girl Ercell Perlman SRPRX'Y Date: 08/28/2018 Reason for consult: Follow-up assessment;Mother's request;NICU baby;Preterm <34wks   2235 - RN called me to assess Ms. Quang. She is day 3 postpartum, and her breasts are full. Her right axilla has a prominent bulge and her left axilla has a less prominent but more diffuse bulge. Given that she has been pumping and that her milk is transitioning, we discussed the most likely cause is milk stasis related to congestion in the breasts. I showed Ms. Boyack some gentle breast massage techniques for the affected areas and recommended a combination of warm moist heat just prior to pumping and ice/cold therapy between pumping sessions. I also recommended that she lie on her back and lift the breasts off her chest wall to encouraged lymphatic drainage. Ms. Macdowell is pumping every 2-3 hours,   I encouraged her to page lactation tomorrow for follow up. Ms. Gambrill verbalized understanding.   Feeding Feeding Type: Donor Breast Milk  Interventions Interventions: Breast massage;Ice  Lactation Tools Discussed/Used     Consult Status Consult Status: Follow-up Date: 08/29/18 Follow-up type: In-patient    Lenore Manner 08/28/2018, 11:11 PM

## 2018-09-09 ENCOUNTER — Ambulatory Visit: Payer: Self-pay

## 2018-09-09 NOTE — Lactation Note (Signed)
This note was copied from a baby's chart. Lactation Consultation Note  Patient Name: Kimberly Santos [id number] Date: 09/09/2018   NICU RN called for assistance but mom was asleep when entering the room. She woke up briefly and told LC that baby had already fed, but that she'd like to have assistance with BF tomorrow. Per mom baby has started latching on at the breast, but he's not swallowing yet. LC to follow up tomorrow to observe a feeding.  Maternal Data    Feeding Feeding Type: Breast Fed  LATCH Score Latch: Grasps breast easily, tongue down, lips flanged, rhythmical sucking.  Audible Swallowing: None  Type of Nipple: Everted at rest and after stimulation  Comfort (Breast/Nipple): Soft / non-tender  Hold (Positioning): No assistance needed to correctly position infant at breast.  LATCH Score: 8  Interventions Interventions: Support pillows  Lactation Tools Discussed/Used     Consult Status      Ardenia Stiner Francene Boyers 09/09/2018, 5:15 PM

## 2018-09-10 ENCOUNTER — Ambulatory Visit: Payer: Self-pay

## 2018-09-10 NOTE — Lactation Note (Signed)
This note was copied from a baby's chart. Lactation Consultation Note  Patient Name: Girl Katyra Tomassetti DPOEU'M Date: 09/10/2018 Reason for consult: Follow-up assessment;Preterm <34wks;NICU baby  P2 mother whose infant is now 22 weeks old.  This was a preterm infant at 33+2 weeks with a CGA of 35+4 weeks and in the NICU.  Mother was preparing to latch baby to the breast when I arrived.  Suggested she feed baby STS and mother removed her bib and onesie.  Mother stated that baby breast fed well at 1500 for 17 minutes.  Discussed always feeding STS, changing a diaper and gently arousing baby prior to latching.  Reviewed expectations of a 32+46 weeks old baby and breast feeding.  Offered to assist with latching and mother agreeable.  Mother immediately began using the cradle hold.  I discussed this hold with her and mentioned that, at this time, it may be more beneficial to use the cross cradle or football hold.  Mother willing to try the cross cradle.  Mother's breasts are large, soft and non tender and nipples are everted.  Attempted to latch baby to the breast but she showed no interest and pushed back.  She became agitated at the breast.  Suggested mother burp but baby did not burp.  She fell asleep on mother's chest.    Discussed that this is typical behavior for a baby at this age who has recently started breast feeding.  Informed mother that babies this age cannot be expected to feed well with every feeding attempt.  When it becomes obvious that baby is not interested it is better to allow them to skip a breast feeding session rather than stress them out at the breast with numerous attempts.  Reassured mother that this is her baby's  time to rest and conserve calories.  We can arrange for another feeding session when baby is interested.  Mother verbalized understanding.    Mother has been pumping consistently at home and wants no bottles used when feeding her baby.  She has not had any issues with  supply.  RN updated.   Maternal Data Formula Feeding for Exclusion: No Has patient been taught Hand Expression?: Yes Does the patient have breastfeeding experience prior to this delivery?: Yes  Feeding Feeding Type: Breast Fed  LATCH Score Latch: Too sleepy or reluctant, no latch achieved, no sucking elicited.  Audible Swallowing: None  Type of Nipple: Everted at rest and after stimulation  Comfort (Breast/Nipple): Soft / non-tender  Hold (Positioning): Assistance needed to correctly position infant at breast and maintain latch.  LATCH Score: 5  Interventions Interventions: Breast feeding basics reviewed;Assisted with latch;Skin to skin;Hand express;Breast compression;Adjust position;DEBP;Position options;Support pillows  Lactation Tools Discussed/Used WIC Program: No   Consult Status Consult Status: PRN Date: 09/10/18 Follow-up type: Call as needed    Lanice Schwab Dannah Ryles 09/10/2018, 6:24 PM

## 2018-09-21 ENCOUNTER — Telehealth: Payer: Self-pay

## 2018-09-21 NOTE — Telephone Encounter (Signed)
Attempted to call patient about her appointment, and how she needed to have her MyChart app downloaded in order to have this visit. Left a detailed message on her voicemail.

## 2018-09-22 ENCOUNTER — Encounter: Payer: Self-pay | Admitting: Student

## 2018-09-22 ENCOUNTER — Telehealth: Payer: 59

## 2018-09-22 ENCOUNTER — Other Ambulatory Visit: Payer: Self-pay

## 2018-09-22 DIAGNOSIS — Z5329 Procedure and treatment not carried out because of patient's decision for other reasons: Secondary | ICD-10-CM

## 2018-09-22 DIAGNOSIS — Z91199 Patient's noncompliance with other medical treatment and regimen due to unspecified reason: Secondary | ICD-10-CM

## 2018-09-22 NOTE — Progress Notes (Signed)
Unable to reach patient for virtual visit. Per notes in hospital admission, patient plans to follow up with provider in [place].  Wende Mott, CNM 09/22/18 10:13 AM

## 2018-09-22 NOTE — Progress Notes (Signed)
9:45a- Called Pt for My Chart visit no answer, will retry in 10 minutes.   10:06a- 2nd attempt, no answer, pt will need to reschedule appointment.left VM.

## 2018-12-07 ENCOUNTER — Other Ambulatory Visit: Payer: Self-pay

## 2018-12-07 DIAGNOSIS — Z20822 Contact with and (suspected) exposure to covid-19: Secondary | ICD-10-CM

## 2018-12-09 LAB — NOVEL CORONAVIRUS, NAA: SARS-CoV-2, NAA: NOT DETECTED

## 2020-03-04 IMAGING — CT CT ABD-PELV W/ CM
1 of 3 series · 13 of 32 positions shown, 19 images · IV contrast (APPLIED)
Comparison: None.

CLINICAL DATA: Right lower quadrant and pelvic pain.

EXAM:
CT ABDOMEN AND PELVIS WITH CONTRAST
TECHNIQUE: Multidetector CT imaging of the abdomen and pelvis was performed
using the standard protocol following bolus administration of
intravenous contrast.
CONTRAST:  100mL [id number] IOPAMIDOL (UIV9TT-DYY) INJECTION 61%

[Series 7: abd/pelvis w/cm · axial · 0.69mm/px · z∈[+712,+1087]mm · 13 of 87 slices shown, 19 images]
[im 6/87  soft-tissue]
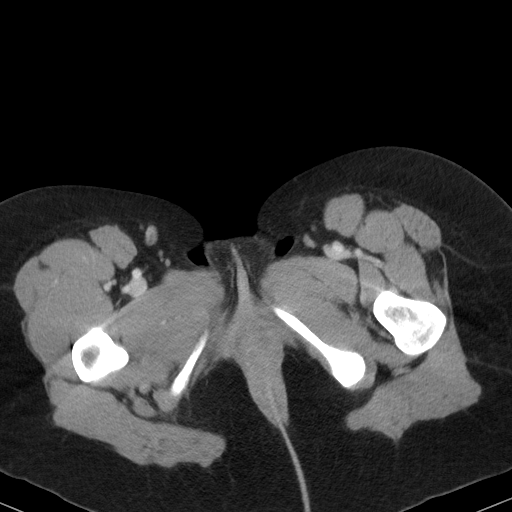
[im 6/87  bone]
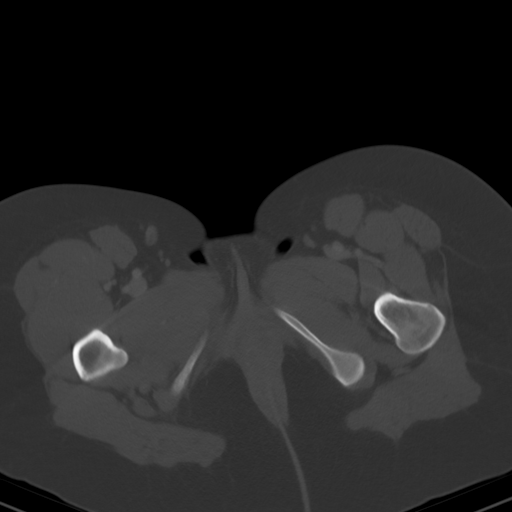
[im 12/87  soft-tissue]
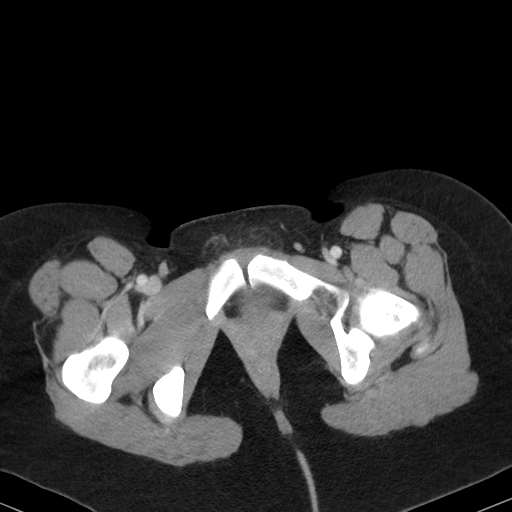
[im 18/87  soft-tissue]
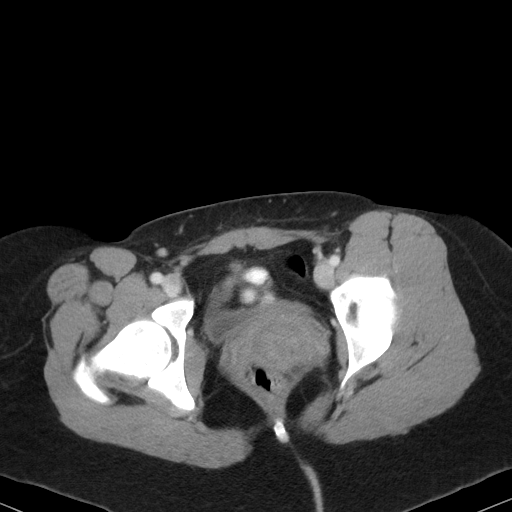
[im 23/87  soft-tissue]
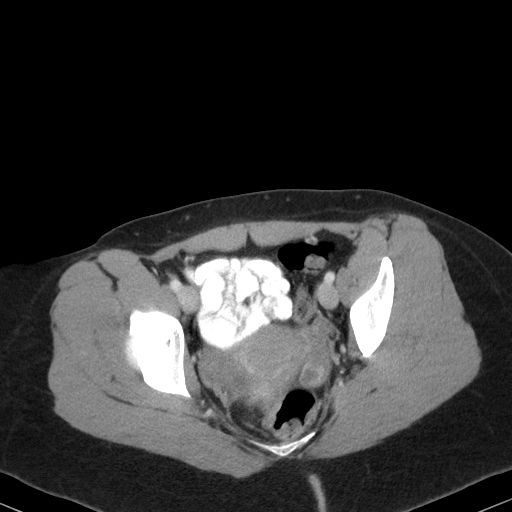
[im 29/87  soft-tissue]
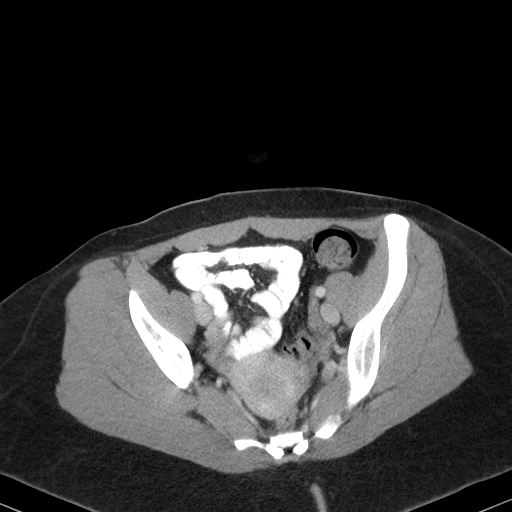
[im 35/87  soft-tissue]
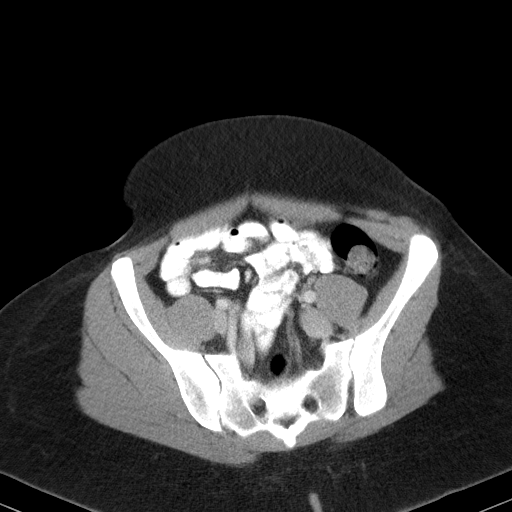
[im 46/87  soft-tissue]
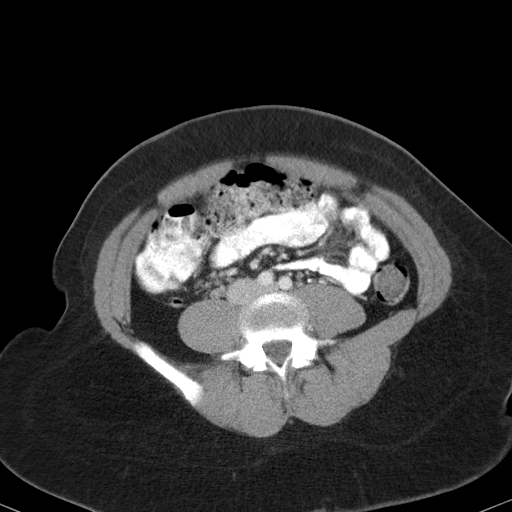
[im 52/87  soft-tissue]
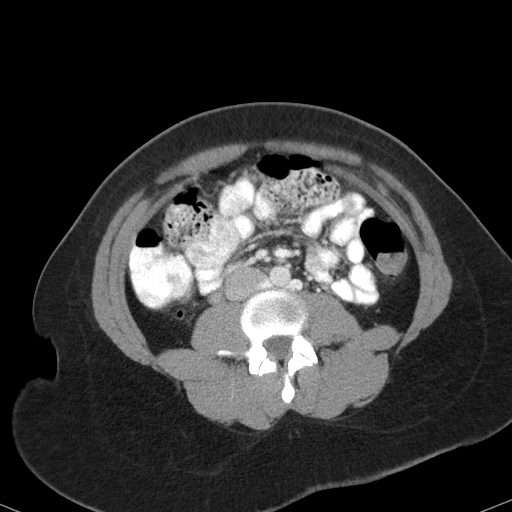
[im 58/87  soft-tissue]
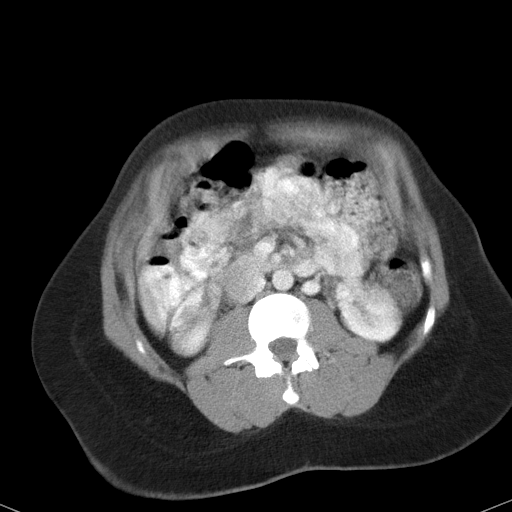
[im 58/87  bone]
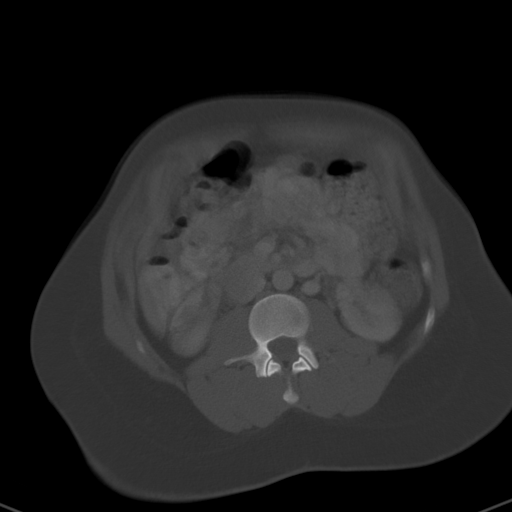
[im 64/87  soft-tissue]
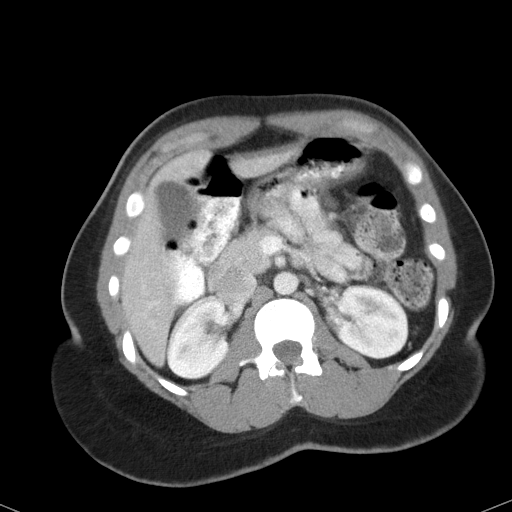
[im 64/87  lung]
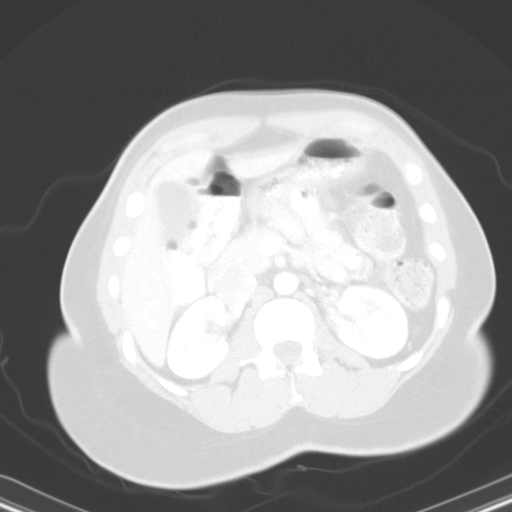
[im 69/87  soft-tissue]
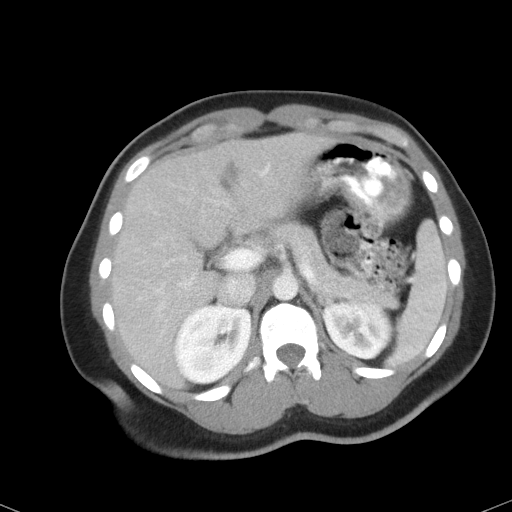
[im 69/87  lung]
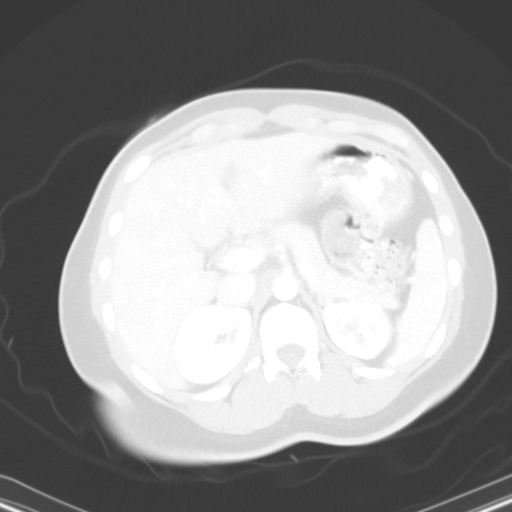
[im 75/87  soft-tissue]
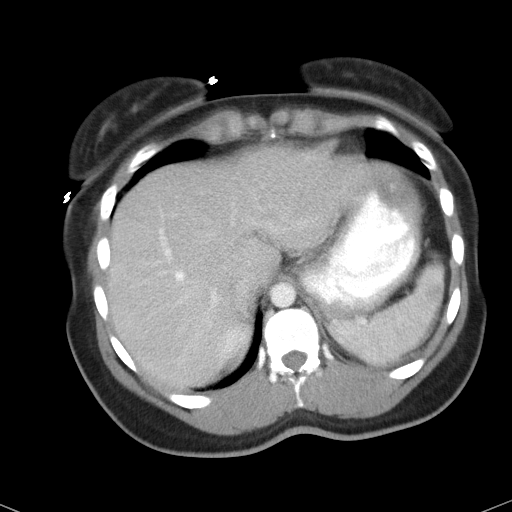
[im 75/87  lung]
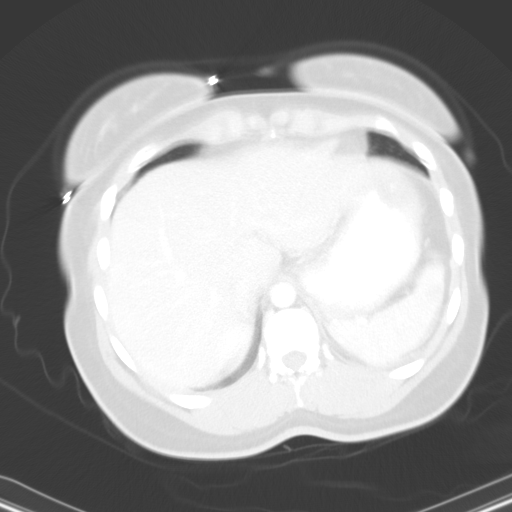
[im 81/87  soft-tissue]
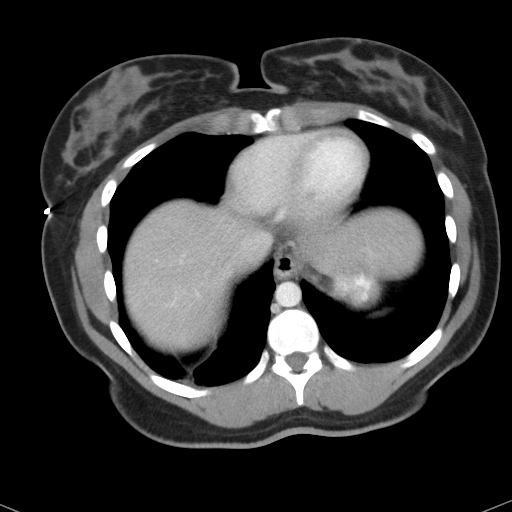
[im 81/87  lung]
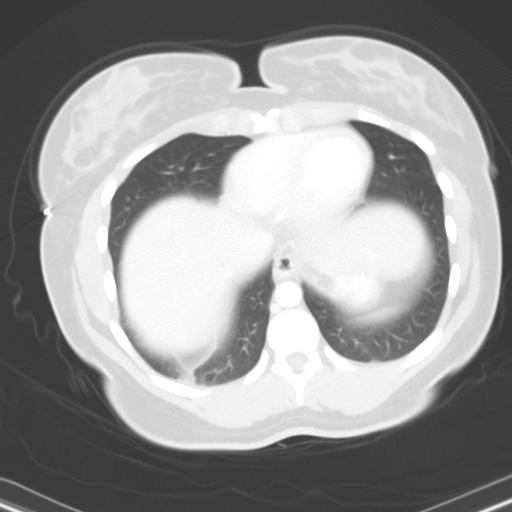

[13 of 32 positions shown; findings below may reference images not displayed]

FINDINGS: Lower Chest: No acute findings.

Hepatobiliary: No hepatic masses identified. Gallbladder is
unremarkable.

Pancreas:  No mass or inflammatory changes.

Spleen: Within normal limits in size. Benign-appearing 1.8 cm cyst
noted.

Adrenals/Urinary Tract: No masses identified. No evidence of
hydronephrosis.

Stomach/Bowel: No evidence of obstruction, inflammatory process or
abnormal fluid collections. Normal appendix visualized.

Vascular/Lymphatic: No pathologically enlarged lymph nodes. No
abdominal aortic aneurysm.

Reproductive: Uterus and adnexal regions are unremarkable. No
evidence of mass, inflammatory process, or abnormal fluid
collections.

Other:  None.

Musculoskeletal:  No suspicious bone lesions identified.
IMPRESSION: No acute findings or other significant abnormality.

## 2020-06-01 IMAGING — US US OB < 14 WEEKS - US OB TV
1 series · 15 of 28 positions shown · non-contrast
Comparison: 11/03/2017 ultrasound

CLINICAL DATA: Abdominal pain in the first trimester pregnancy.

EXAM:
OBSTETRIC <14 WK US AND TRANSVAGINAL OB US
TECHNIQUE: Both transabdominal and transvaginal ultrasound examinations were
performed for complete evaluation of the gestation as well as the
maternal uterus, adnexal regions, and pelvic cul-de-sac.
Transvaginal technique was performed to assess early pregnancy.

[Series 1: us ob < 14 weeks - us ob tv · 68 acquisitions, 15 frames shown]
[im 1/68]
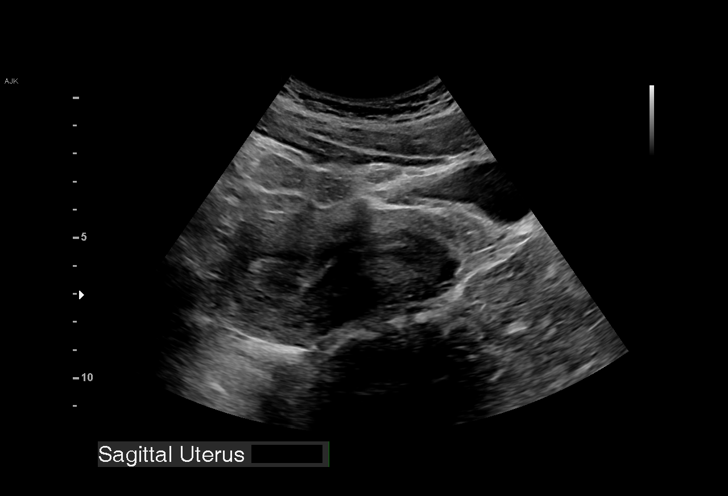
[im 5/68]
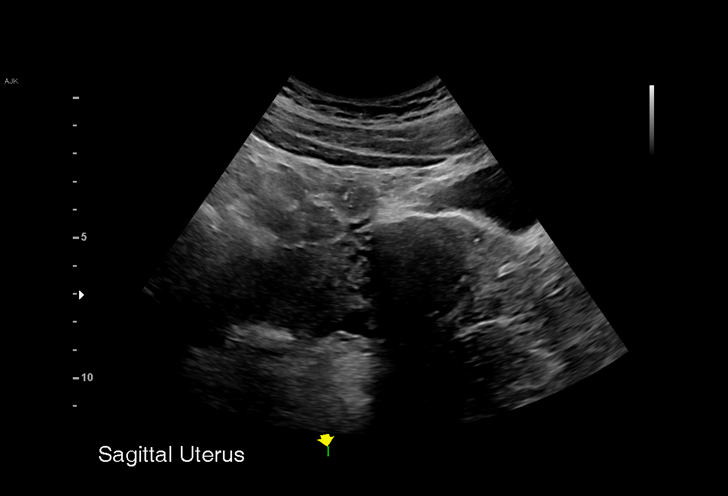
[im 10/68]
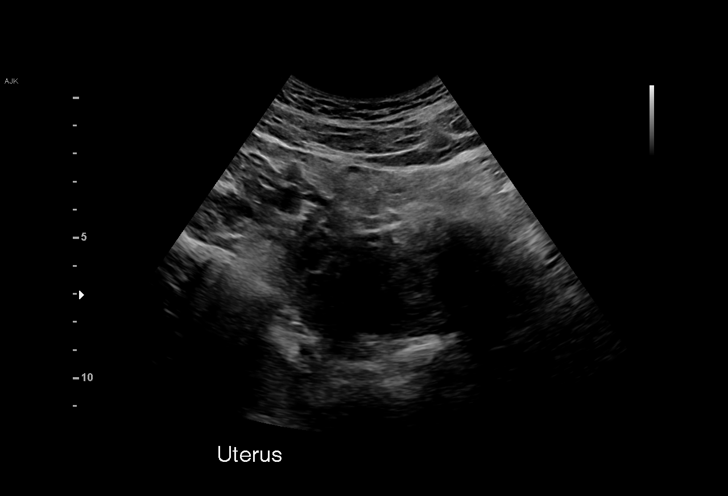
[im 15/68]
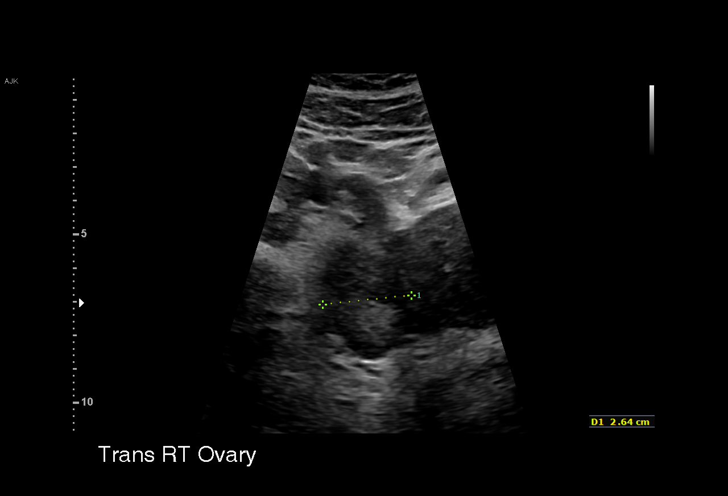
[im 20/68]
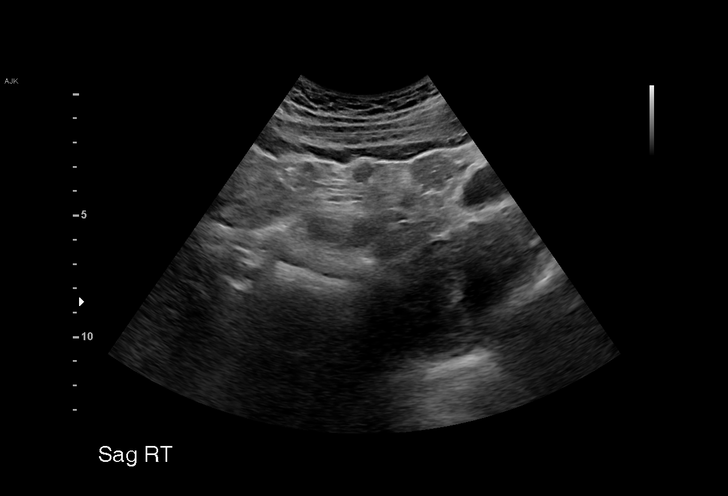
[im 25/68]
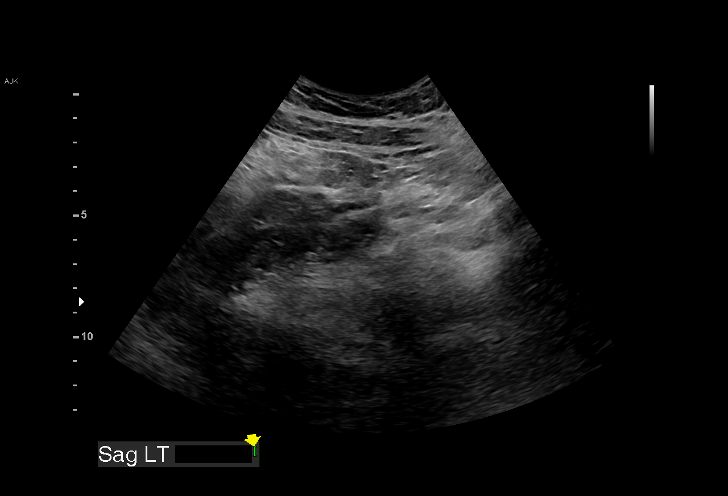
[im 30/68]
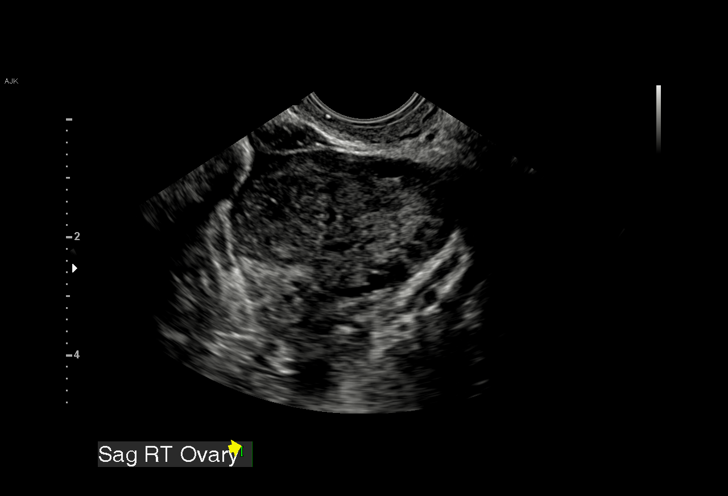
[im 35/68]
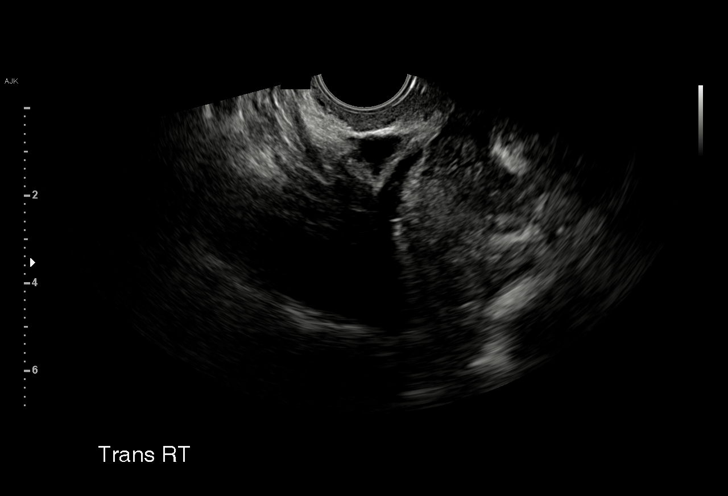
[im 38/68]
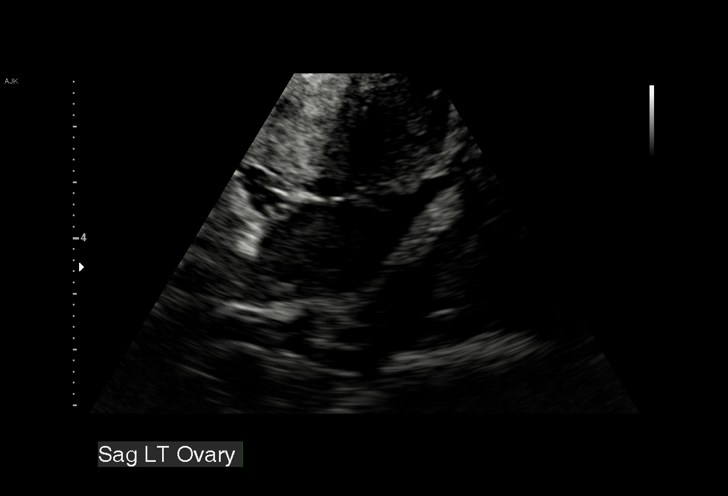
[im 43/68]
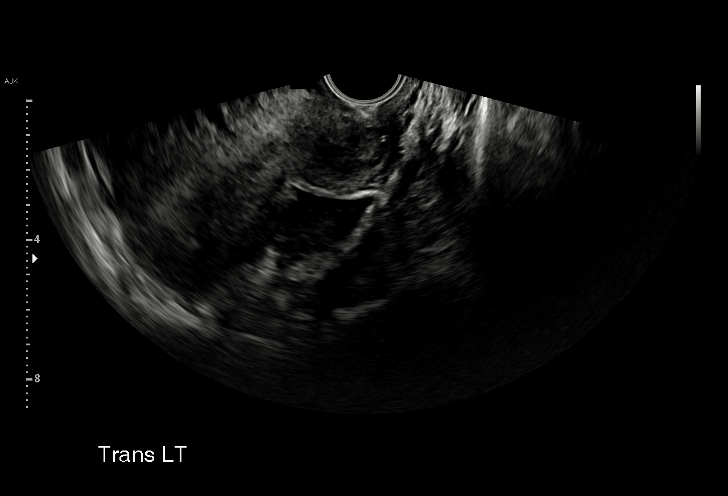
[im 48/68]
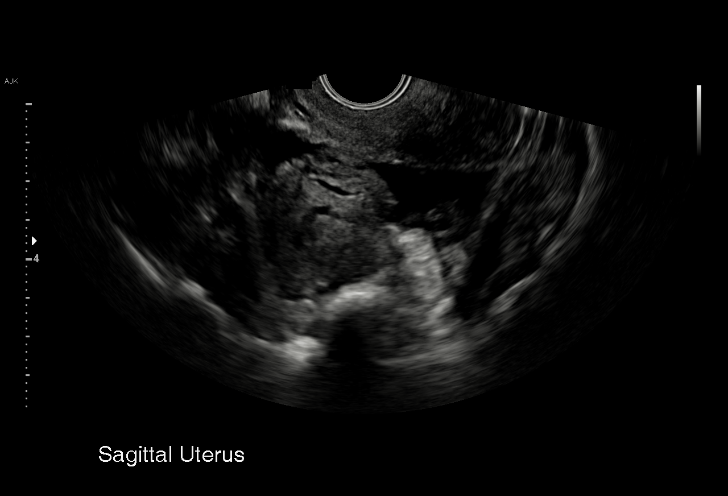
[im 53/68]
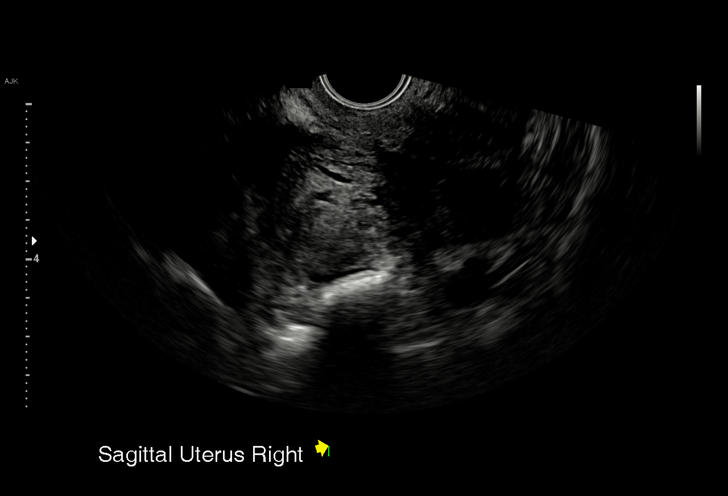
[im 58/68]
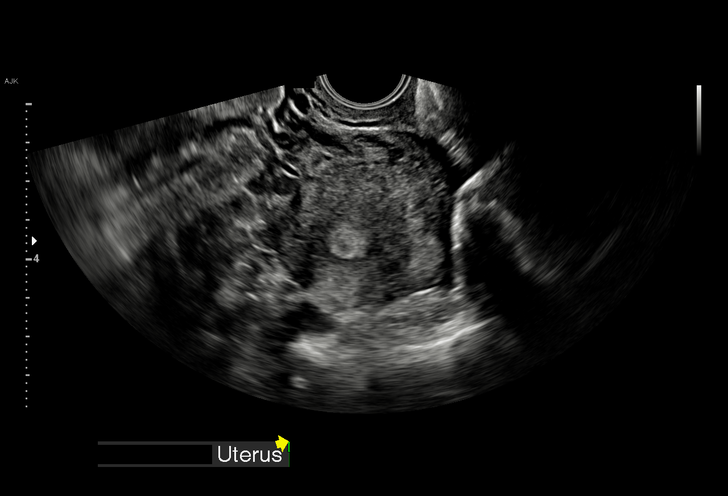
[im 63/68]
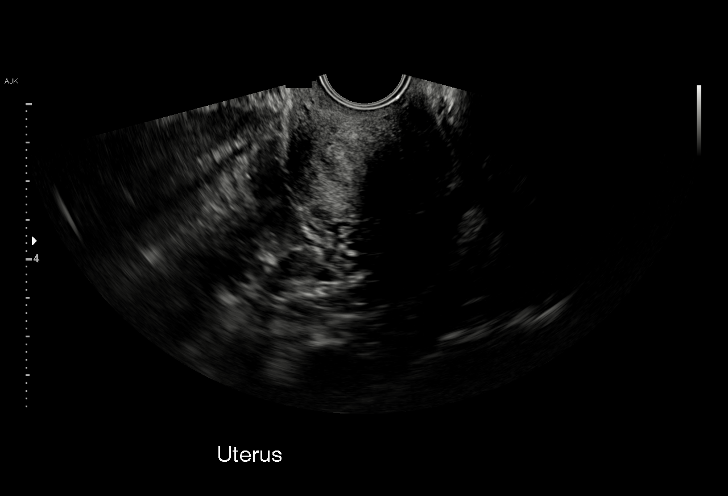
[im 68/68]
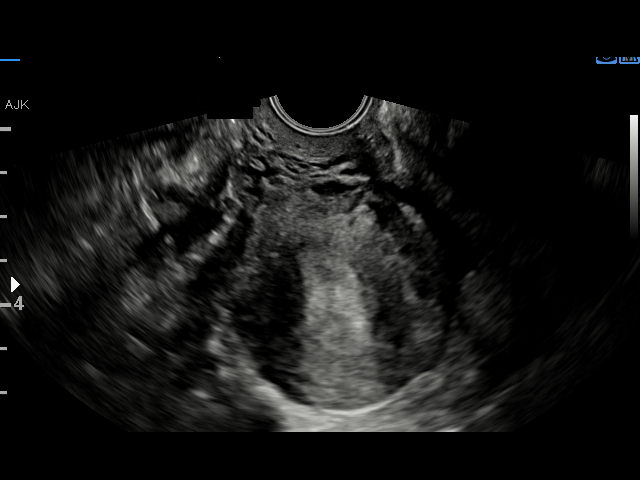

[15 of 28 positions shown; findings below may reference images not displayed]

FINDINGS: Intrauterine gestational sac: Single

Yolk sac:  Not Visualized.

Embryo:  Not Visualized.

Cardiac Activity: Not applicable

Heart Rate: Not applicable

MSD: 2.4 mm   4 w   6 d

Subchorionic hemorrhage:  None visualized.

Maternal uterus/adnexae: Corpus luteum on the right normal left
ovary.
IMPRESSION: Tiny anechoic 2.4 mm focus within the endometrial cavity may
represent an early intrauterine gestational sac, but no yolk sac,
fetal pole, or cardiac activity are yet visualized. Recommend
follow-up quantitative B-HCG levels and follow-up US in 14 days to
assess viability. This recommendation follows SRU consensus
guidelines: Diagnostic Criteria for Nonviable Pregnancy Early in the
First Trimester. N Engl J Med 3705; [DATE].

## 2020-12-15 IMAGING — US US MFM OB FOLLOW UP
1 series · 14 of 28 positions shown · non-contrast
Comparison: none

[Series 1: us mfm ob follow up · 40 acquisitions, 14 frames shown]
[im 2/40]
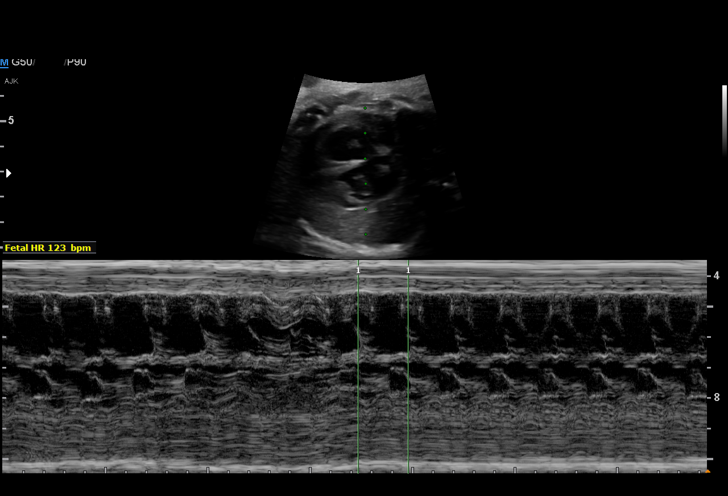
[im 5/40]
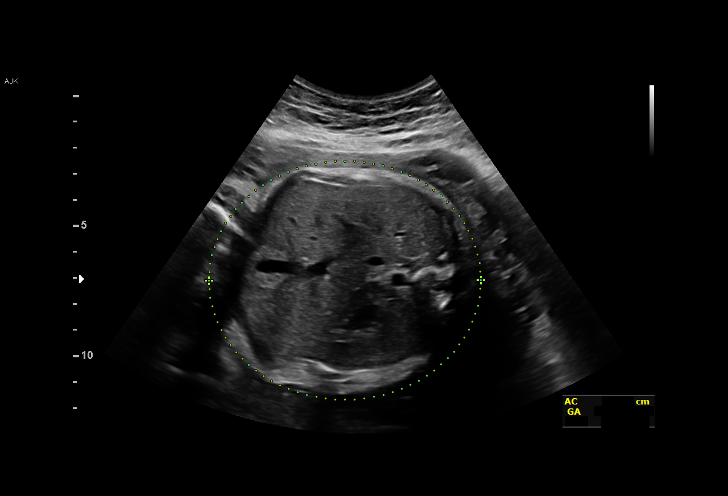
[im 8/40]
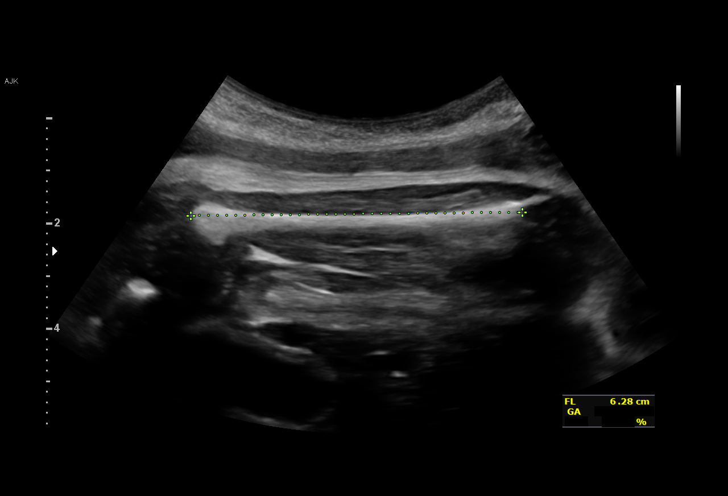
[im 11/40]
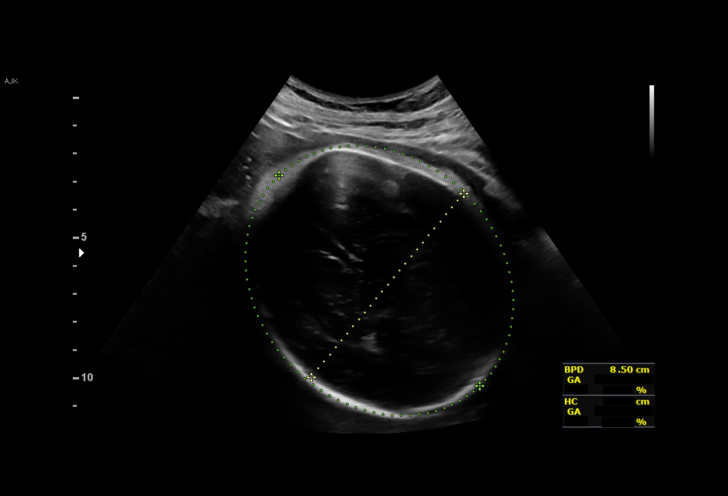
[im 14/40]
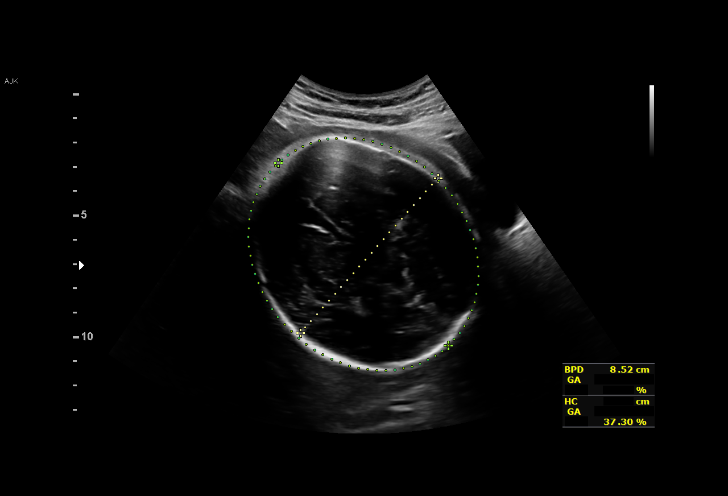
[im 16/40]
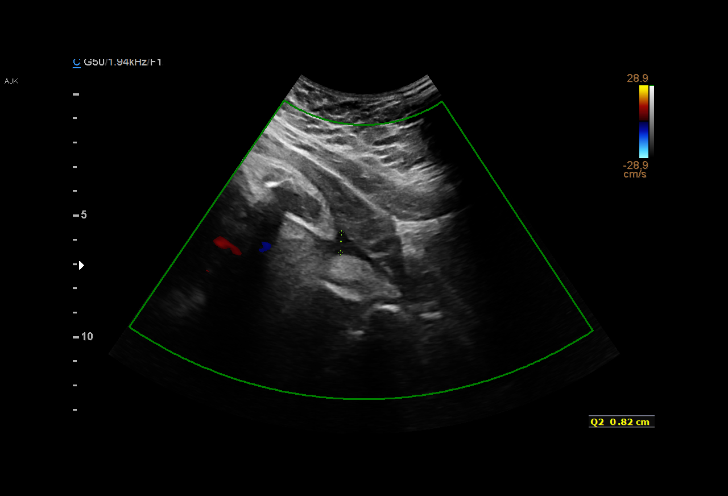
[im 19/40]
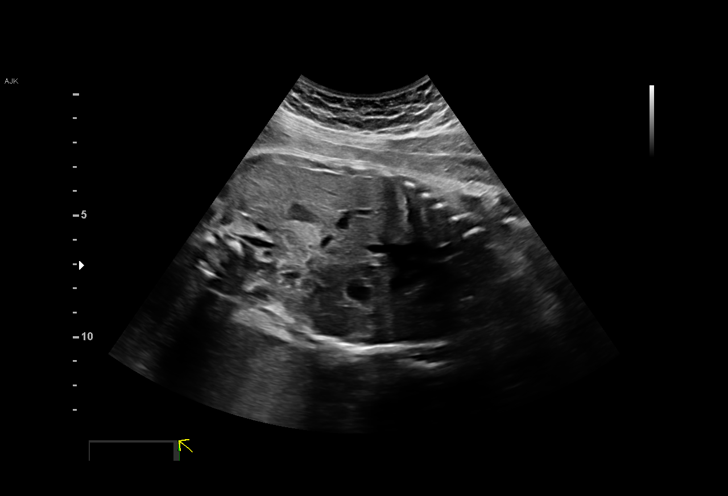
[im 22/40]
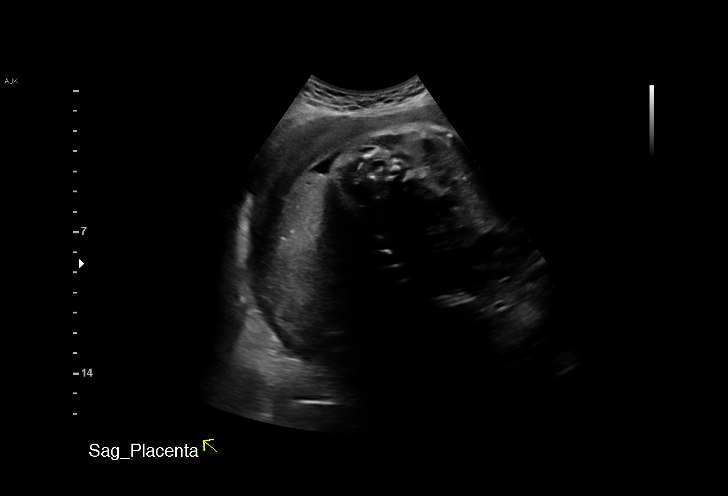
[im 25/40]
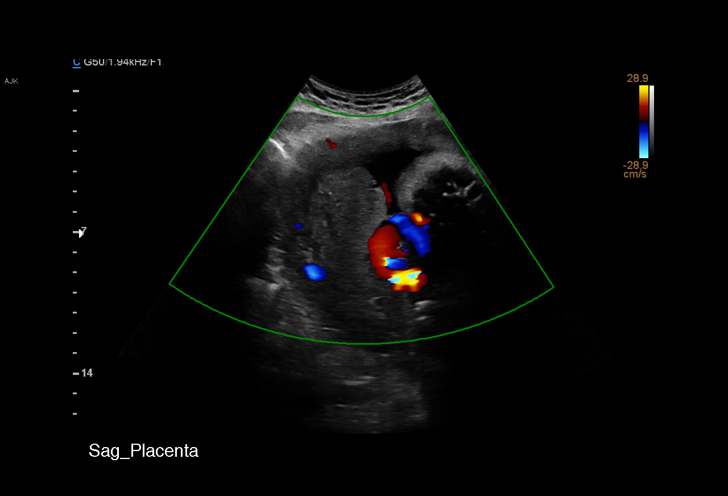
[im 28/40]
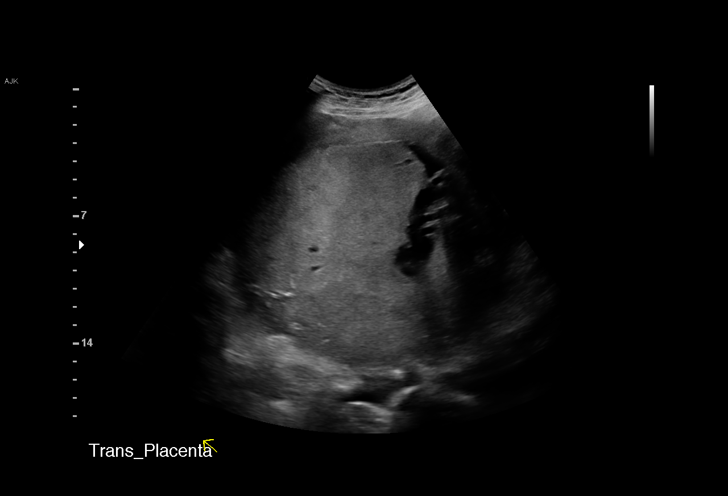
[im 31/40]
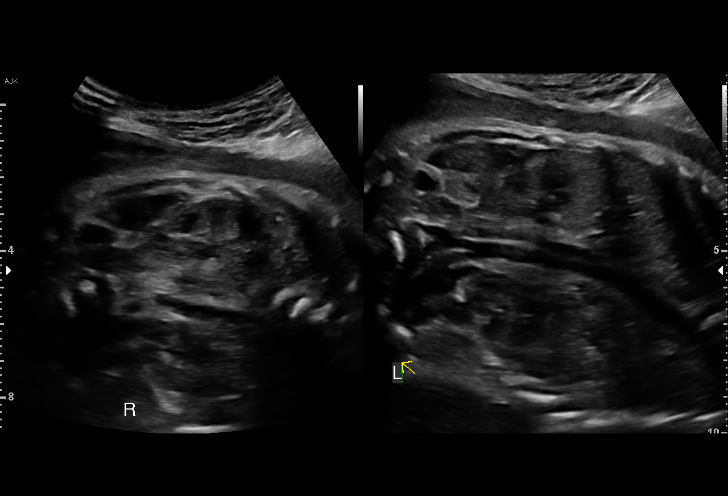
[im 34/40]
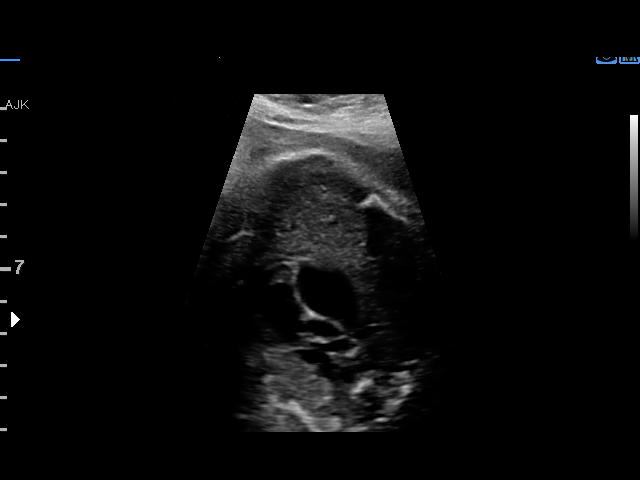
[im 37/40]
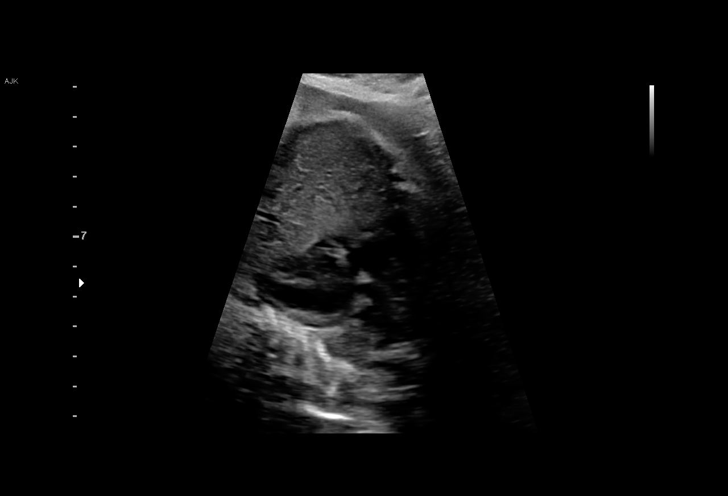
[im 40/40]
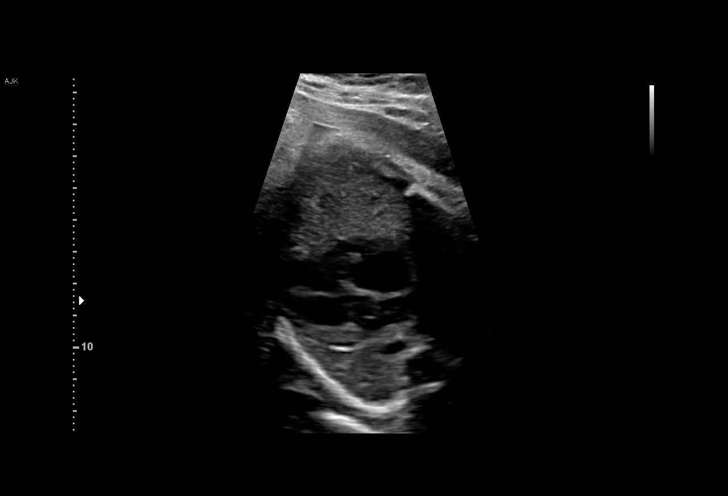

[14 of 28 positions shown; findings below may reference images not displayed]

2303 [REDACTED]

 ----------------------------------------------------------------------

 ----------------------------------------------------------------------
Indications

  Premature rupture of membranes - leaking
  fluid
  32 weeks gestation of pregnancy
 ----------------------------------------------------------------------
Vital Signs

                                                Height:        5'5"
Fetal Evaluation

 Num Of Fetuses:         1
 Fetal Heart Rate(bpm):  123
 Cardiac Activity:       Observed
 Presentation:           Cephalic
 Placenta:               Posterior

 Amniotic Fluid
 AFI FV:      Subjectively low-normal

 AFI Sum(cm)     %Tile       Largest Pocket(cm)
 8.99            8

 RUQ(cm)       RLQ(cm)       LUQ(cm)        LLQ(cm)

Biometry

 BPD:        85  mm     G. Age:  34w 2d         92  %    CI:        79.93   %    70 - 86
                                                         FL/HC:      20.8   %    19.1 -
 HC:      300.4  mm     G. Age:  33w 2d         44  %    HC/AC:      0.97        0.96 -
 AC:       311   mm     G. Age:  35w 0d         99  %    FL/BPD:     73.5   %    71 - 87
 FL:       62.5  mm     G. Age:  32w 3d         43  %    FL/AC:      20.1   %    20 - 24
 Est. FW:    1225  gm      5 lb 3 oz     92  %
OB History

 Gravidity:    4         Term:   1        Prem:   0        SAB:   0
 TOP:          2       Ectopic:  0        Living: 1
Gestational Age

 LMP:           32w 1d        Date:  01/12/18                 EDD:   10/19/18
 U/S Today:     33w 5d                                        EDD:   10/08/18
 Best:          32w 1d     Det. By:  LMP  (01/12/18)          EDD:   10/19/18
Anatomy

 Cranium:               Appears normal         Aortic Arch:            Previously seen
 Cavum:                 Appears normal         Ductal Arch:            Previously seen
 Ventricles:            Previously seen        Diaphragm:              Appears normal
 Choroid Plexus:        Previously seen        Stomach:                Appears normal, left
                                                                       sided
 Cerebellum:            Previously seen        Abdomen:                Appears normal
 Posterior Fossa:       Previously seen        Abdominal Wall:         Previously seen
 Nuchal Fold:           Not applicable (>20    Cord Vessels:           Previously seen
                        wks GA)
 Face:                  Orbits and profile     Kidneys:                Appear normal
                        previously seen
 Lips:                  Previously seen        Bladder:                Appears normal
 Thoracic:              Appears normal         Spine:                  Previously seen
 Heart:                 Appears normal         Upper Extremities:      Previously seen
                        (4CH, axis, and
                        situs)
 RVOT:                  Appears normal         Lower Extremities:      Previously seen
 LVOT:                  Appears normal

 Other:  Nasal bone visualized. Heels and 5th digit previously visualized.
         Fetus appears to be female.
Impression

 Patient is admitted with diagnosis of PPROM.
 The estimated fetal weight is at greater than the 90th
 percentile. Amniotic fluid is normal and good fetal activity is
 seen.
 NST is reactive.
Recommendations

 -BPP tomorrow and then weekly till delivery.
                 Larocque, Najia
